# Patient Record
Sex: Female | Born: 1945 | Race: White | Hispanic: No | Marital: Married | Smoking: Former smoker
Health system: Southern US, Community
[De-identification: ages and names within clinical notes are randomized; demographics above are authoritative.]

## PROBLEM LIST (undated history)

## (undated) DIAGNOSIS — I10 Essential (primary) hypertension: Secondary | ICD-10-CM

## (undated) DIAGNOSIS — IMO0002 Reserved for concepts with insufficient information to code with codable children: Secondary | ICD-10-CM

## (undated) DIAGNOSIS — E079 Disorder of thyroid, unspecified: Secondary | ICD-10-CM

## (undated) DIAGNOSIS — L719 Rosacea, unspecified: Secondary | ICD-10-CM

## (undated) HISTORY — PX: TUBAL LIGATION: SHX77

## (undated) HISTORY — DX: Disorder of thyroid, unspecified: E07.9

## (undated) HISTORY — DX: Essential (primary) hypertension: I10

## (undated) HISTORY — DX: Reserved for concepts with insufficient information to code with codable children: IMO0002

## (undated) HISTORY — DX: Rosacea, unspecified: L71.9

## (undated) HISTORY — PX: BILATERAL TEMPOROMANDIBULAR JOINT ARTHROPLASTY: SUR77

---

## 1998-03-21 ENCOUNTER — Other Ambulatory Visit: Admission: RE | Admit: 1998-03-21 | Discharge: 1998-03-21 | Payer: Self-pay | Admitting: Obstetrics & Gynecology

## 1999-03-22 ENCOUNTER — Other Ambulatory Visit: Admission: RE | Admit: 1999-03-22 | Discharge: 1999-03-22 | Payer: Self-pay | Admitting: Obstetrics & Gynecology

## 1999-04-02 ENCOUNTER — Other Ambulatory Visit: Admission: RE | Admit: 1999-04-02 | Discharge: 1999-04-02 | Payer: Self-pay | Admitting: Obstetrics & Gynecology

## 2000-04-13 ENCOUNTER — Other Ambulatory Visit: Admission: RE | Admit: 2000-04-13 | Discharge: 2000-04-13 | Payer: Self-pay | Admitting: Obstetrics & Gynecology

## 2000-04-14 ENCOUNTER — Encounter (INDEPENDENT_AMBULATORY_CARE_PROVIDER_SITE_OTHER): Payer: Self-pay | Admitting: Specialist

## 2000-04-14 ENCOUNTER — Other Ambulatory Visit: Admission: RE | Admit: 2000-04-14 | Discharge: 2000-04-14 | Payer: Self-pay | Admitting: Obstetrics & Gynecology

## 2000-11-23 ENCOUNTER — Other Ambulatory Visit: Admission: RE | Admit: 2000-11-23 | Discharge: 2000-11-23 | Payer: Self-pay | Admitting: Obstetrics & Gynecology

## 2001-06-22 ENCOUNTER — Other Ambulatory Visit: Admission: RE | Admit: 2001-06-22 | Discharge: 2001-06-22 | Payer: Self-pay | Admitting: Obstetrics & Gynecology

## 2002-04-04 ENCOUNTER — Encounter: Payer: Self-pay | Admitting: Internal Medicine

## 2002-04-04 ENCOUNTER — Ambulatory Visit (HOSPITAL_COMMUNITY): Admission: RE | Admit: 2002-04-04 | Discharge: 2002-04-04 | Payer: Self-pay | Admitting: Internal Medicine

## 2002-04-08 ENCOUNTER — Encounter (INDEPENDENT_AMBULATORY_CARE_PROVIDER_SITE_OTHER): Payer: Self-pay | Admitting: *Deleted

## 2002-04-08 ENCOUNTER — Ambulatory Visit (HOSPITAL_COMMUNITY): Admission: RE | Admit: 2002-04-08 | Discharge: 2002-04-08 | Payer: Self-pay | Admitting: Internal Medicine

## 2002-04-08 ENCOUNTER — Encounter: Payer: Self-pay | Admitting: Internal Medicine

## 2002-08-17 ENCOUNTER — Other Ambulatory Visit: Admission: RE | Admit: 2002-08-17 | Discharge: 2002-08-17 | Payer: Self-pay | Admitting: Obstetrics & Gynecology

## 2003-05-12 ENCOUNTER — Ambulatory Visit (HOSPITAL_COMMUNITY): Admission: RE | Admit: 2003-05-12 | Discharge: 2003-05-12 | Payer: Self-pay | Admitting: Internal Medicine

## 2003-05-12 ENCOUNTER — Encounter: Payer: Self-pay | Admitting: Internal Medicine

## 2003-09-14 ENCOUNTER — Other Ambulatory Visit: Admission: RE | Admit: 2003-09-14 | Discharge: 2003-09-14 | Payer: Self-pay | Admitting: Obstetrics & Gynecology

## 2004-11-14 ENCOUNTER — Other Ambulatory Visit: Admission: RE | Admit: 2004-11-14 | Discharge: 2004-11-14 | Payer: Self-pay | Admitting: Obstetrics & Gynecology

## 2006-02-02 ENCOUNTER — Ambulatory Visit (HOSPITAL_COMMUNITY): Admission: RE | Admit: 2006-02-02 | Discharge: 2006-02-02 | Payer: Self-pay | Admitting: Internal Medicine

## 2006-03-17 ENCOUNTER — Encounter (INDEPENDENT_AMBULATORY_CARE_PROVIDER_SITE_OTHER): Payer: Self-pay | Admitting: *Deleted

## 2006-03-17 ENCOUNTER — Ambulatory Visit (HOSPITAL_COMMUNITY): Admission: RE | Admit: 2006-03-17 | Discharge: 2006-03-17 | Payer: Self-pay | Admitting: Internal Medicine

## 2006-03-20 ENCOUNTER — Other Ambulatory Visit: Admission: RE | Admit: 2006-03-20 | Discharge: 2006-03-20 | Payer: Self-pay | Admitting: Obstetrics & Gynecology

## 2007-04-12 ENCOUNTER — Ambulatory Visit (HOSPITAL_COMMUNITY): Admission: RE | Admit: 2007-04-12 | Discharge: 2007-04-12 | Payer: Self-pay | Admitting: Internal Medicine

## 2008-05-02 ENCOUNTER — Emergency Department (HOSPITAL_COMMUNITY): Admission: EM | Admit: 2008-05-02 | Discharge: 2008-05-02 | Payer: Self-pay | Admitting: Emergency Medicine

## 2011-01-30 ENCOUNTER — Other Ambulatory Visit: Payer: Self-pay | Admitting: Obstetrics & Gynecology

## 2011-01-30 DIAGNOSIS — R928 Other abnormal and inconclusive findings on diagnostic imaging of breast: Secondary | ICD-10-CM

## 2011-02-05 ENCOUNTER — Ambulatory Visit
Admission: RE | Admit: 2011-02-05 | Discharge: 2011-02-05 | Disposition: A | Discharge status: Home / Self Care | Payer: BC Managed Care – PPO | Source: Ambulatory Visit | Attending: Obstetrics & Gynecology | Admitting: Obstetrics & Gynecology

## 2011-02-05 DIAGNOSIS — R928 Other abnormal and inconclusive findings on diagnostic imaging of breast: Secondary | ICD-10-CM

## 2011-02-26 ENCOUNTER — Other Ambulatory Visit: Payer: Self-pay | Admitting: Internal Medicine

## 2011-02-26 DIAGNOSIS — E042 Nontoxic multinodular goiter: Secondary | ICD-10-CM

## 2011-03-04 ENCOUNTER — Ambulatory Visit (HOSPITAL_COMMUNITY)
Admission: RE | Admit: 2011-03-04 | Discharge: 2011-03-04 | Disposition: A | Discharge status: Home / Self Care | Payer: BC Managed Care – PPO | Source: Ambulatory Visit | Attending: Internal Medicine | Admitting: Internal Medicine

## 2011-03-04 DIAGNOSIS — E042 Nontoxic multinodular goiter: Secondary | ICD-10-CM

## 2012-02-11 ENCOUNTER — Encounter: Payer: Self-pay | Admitting: Internal Medicine

## 2012-02-16 ENCOUNTER — Encounter: Payer: Self-pay | Admitting: Internal Medicine

## 2012-02-24 ENCOUNTER — Ambulatory Visit (AMBULATORY_SURGERY_CENTER): Payer: BC Managed Care – PPO

## 2012-02-24 VITALS — Ht 67.0 in | Wt 166.4 lb

## 2012-02-24 DIAGNOSIS — Z1211 Encounter for screening for malignant neoplasm of colon: Secondary | ICD-10-CM

## 2012-02-24 MED ORDER — PEG-KCL-NACL-NASULF-NA ASC-C 100 G PO SOLR: 1.0000 | Freq: Once | ORAL | Status: AC

## 2012-02-25 ENCOUNTER — Encounter: Payer: Self-pay | Admitting: Internal Medicine

## 2012-03-09 ENCOUNTER — Encounter: Payer: BC Managed Care – PPO | Admitting: Internal Medicine

## 2012-03-09 ENCOUNTER — Ambulatory Visit (AMBULATORY_SURGERY_CENTER): Payer: BC Managed Care – PPO | Admitting: Internal Medicine

## 2012-03-09 ENCOUNTER — Encounter: Payer: Self-pay | Admitting: Internal Medicine

## 2012-03-09 VITALS — BP 160/74 | HR 76 | Temp 97.0°F | Resp 18 | Ht 67.0 in | Wt 166.0 lb

## 2012-03-09 DIAGNOSIS — Z1211 Encounter for screening for malignant neoplasm of colon: Secondary | ICD-10-CM

## 2012-03-09 MED ORDER — SODIUM CHLORIDE 0.9 % IV SOLN: 500.0000 mL | INTRAVENOUS | Status: DC

## 2012-03-09 NOTE — Progress Notes (Signed)
Patient did not experience any of the following events: a burn prior to discharge; a fall within the facility; wrong site/side/patient/procedure/implant event; or a hospital transfer or hospital admission upon discharge from the facility. (G8907) Patient did not have preoperative order for IV antibiotic SSI prophylaxis. (G8918)  

## 2012-03-09 NOTE — Op Note (Signed)
[facility] [address] [place], [place]  [address]  COLONOSCOPY PROCEDURE REPORT  PATIENT:  Marilyn [name], Marilyn [name]  MR#:  [id number] BIRTHDATE:  01/31/1946, 65 yrs. old  GENDER:  female ENDOSCOPIST:  Hedwig Morton. Juanda Chance, MD REF. BY:  Lynnea Ferrier, M.D. PROCEDURE DATE:  03/09/2012 PROCEDURE:  Colonoscopy 29528 ASA CLASS:  Class II INDICATIONS:  colorectal cancer screening, average risk last colon 2003 MEDICATIONS:   MAC sedation, administered by CRNA, propofol (Diprivan) 200 mg  DESCRIPTION OF PROCEDURE:   After the risks and benefits and of the procedure were explained, informed consent was obtained. Digital rectal exam was performed and revealed no rectal masses. The LB 180AL [id number] endoscope was introduced through the anus and advanced to the cecum, which was identified by both the appendix and ileocecal valve.  The quality of the prep was good, using MoviPrep.  The instrument was then slowly withdrawn as the colon was fully examined. <<PROCEDUREIMAGES>>  FINDINGS:  No polyps or cancers were seen (see image1, image2, image3, image4, and image5).   Retroflexed views in the rectum revealed no abnormalities.    The scope was then withdrawn from the patient and the procedure completed.  COMPLICATIONS:  None ENDOSCOPIC IMPRESSION: 1) No polyps or cancers 2) Normal colonoscopy RECOMMENDATIONS: 1) High fiber diet.  REPEAT EXAM:  In 10 year(s) for.  ______________________________ Hedwig Morton. Juanda Chance, MD  CC:  n. eSIGNED:   Hedwig Morton. Jaquala Fuller at 03/09/2012 05:46 PM  [place], [phone]

## 2012-03-09 NOTE — Patient Instructions (Signed)

## 2012-03-10 ENCOUNTER — Telehealth: Payer: Self-pay | Admitting: *Deleted

## 2012-03-10 NOTE — Telephone Encounter (Signed)
  Follow up Call-  Call back number 03/09/2012  Post procedure Call Back phone  # [phone]  Permission to leave phone message Yes     Patient questions:  Do you have a fever, pain , or abdominal swelling? no Pain Score  0 *  Have you tolerated food without any problems? yes  Have you been able to return to your normal activities? yes  Do you have any questions about your discharge instructions: Diet   no Medications  no Follow up visit  no  Do you have questions or concerns about your Care? no  Actions: * If pain score is 4 or above: No action needed, pain <4.

## 2013-04-05 ENCOUNTER — Other Ambulatory Visit: Payer: Self-pay | Admitting: Obstetrics & Gynecology

## 2013-04-18 ENCOUNTER — Other Ambulatory Visit: Payer: Self-pay | Admitting: Obstetrics & Gynecology

## 2013-04-18 DIAGNOSIS — R928 Other abnormal and inconclusive findings on diagnostic imaging of breast: Secondary | ICD-10-CM

## 2013-04-20 ENCOUNTER — Ambulatory Visit (INDEPENDENT_AMBULATORY_CARE_PROVIDER_SITE_OTHER): Payer: Medicare Other | Admitting: Physician Assistant

## 2013-04-20 ENCOUNTER — Encounter: Payer: Self-pay | Admitting: Physician Assistant

## 2013-04-20 VITALS — BP 144/88 | HR 72 | Temp 98.2°F | Resp 18 | Ht 65.5 in | Wt 163.0 lb

## 2013-04-20 DIAGNOSIS — I10 Essential (primary) hypertension: Secondary | ICD-10-CM

## 2013-04-20 MED ORDER — BENAZEPRIL HCL 10 MG PO TABS: 10.0000 mg | ORAL_TABLET | Freq: Every day | ORAL | Status: DC

## 2013-04-20 NOTE — Progress Notes (Signed)
   Patient ID: Marilyn [name] MRN: [id number], DOB: 1946-12-07, 67 y.o. Date of Encounter: 04/20/2013, 5:06 PM    Chief Complaint:  Chief Complaint  Patient presents with  . BP high at gyn exam last week  174/93     HPI: 66 y.o. year old female here to evaluate high blood pressure. Has no prior h/o high BP. Nurse for [facility] checked it 3 weeks ago-got 150/80. Pt went to Gyn last week-BP was 174/93. Pt has checked it some since and has gotten 140s/90.  She is taking no new medications. No decongestants. No caffeine, energy drinks etc. Has had no angina symptoms, stroke symptoms.     Home Meds: Current Outpatient Prescriptions on File Prior to Visit  Medication Sig Dispense Refill  . aspirin 81 MG tablet Take 81 mg by mouth daily.      . B Complex-Biotin-FA (B-COMPLEX PO) Take by mouth daily.      . calcium carbonate (OS-CAL) 600 MG TABS Take 600 mg by mouth daily.      Marland Kitchen estradiol (ESTRACE) 2 MG tablet Take 2 mg by mouth daily.      . Fish Oil-Cholecalciferol (FISH OIL + D3 PO) Take by mouth. Fish Oil 1200 mg/ D3 2000 IU  Take one daily      . Glucosamine-Chondroitin (COSAMIN DS PO) Take by mouth daily. Take 1 pill on odd days and 2 pills on even days      . levothyroxine (SYNTHROID, LEVOTHROID) 75 MCG tablet Take 75 mcg by mouth daily.      . vitamin E (VITAMIN E) 1000 UNIT capsule Take 1,000 Units by mouth daily.       No current facility-administered medications on file prior to visit.    Allergies: No Known Allergies    Review of Systems: See HPI. Other ros negative.   Physical Exam: Blood pressure 144/88, pulse 72, temperature 98.2 F (36.8 C), temperature source Oral, resp. rate 18, height 5' 5.5" (1.664 m), weight 163 lb (73.936 kg)., Body mass index is 26.7 kg/(m^2). General: Well developed, well nourished,WF in no acute distress. Neck: Supple. No thyromegaly. Full ROM. No lymphadenopathy.No carotid bruits. Lungs: Clear bilaterally to auscultation without wheezes, rales,  or rhonchi. Breathing is unlabored. Heart: Regular rhythm. No murmurs, rubs, or gallops. Abdomen: Soft, non-tender, non-distended with normoactive bowel sounds. No hepatomegaly. No rebound/guarding. No obvious abdominal masses. Msk:  Strength and tone normal for age. Extremities/Skin: Warm and dry. No clubbing or cyanosis. No edema. No rashes or suspicious lesions. Neuro: Alert and oriented X 3. Moves all extremities spontaneously. Gait is normal. CNII-XII grossly in tact. Psych:  Responds to questions appropriately with a normal affect.     ASSESSMENT AND PLAN:  67 y.o. year old female with  1. Essential hypertension, benign Discussed low sodium foods. Avoid canned foods, frozen dinners, other high sodium foods. Continue cardiovascular exercise. (Has Family H/O HTN). - benazepril (LOTENSIN) 10 MG tablet; Take 1 tablet (10 mg total) by mouth daily.  She is going out of town May 3rd and will be gone for 4 weeks.  She is to [facility] in one week (prior to trip) to recheck BP and BMET on medication.    [address] [place], [place], [facility] 04/20/2013 5:06 PM

## 2013-04-29 ENCOUNTER — Encounter: Payer: Self-pay | Admitting: Physician Assistant

## 2013-04-29 ENCOUNTER — Ambulatory Visit
Admission: RE | Admit: 2013-04-29 | Discharge: 2013-04-29 | Disposition: A | Discharge status: Home / Self Care | Payer: Medicare Other | Source: Ambulatory Visit | Attending: Obstetrics & Gynecology | Admitting: Obstetrics & Gynecology

## 2013-04-29 ENCOUNTER — Ambulatory Visit (INDEPENDENT_AMBULATORY_CARE_PROVIDER_SITE_OTHER): Payer: Medicare Other | Admitting: Physician Assistant

## 2013-04-29 VITALS — BP 116/78 | HR 64 | Resp 18 | Ht 66.5 in | Wt 162.0 lb

## 2013-04-29 DIAGNOSIS — R928 Other abnormal and inconclusive findings on diagnostic imaging of breast: Secondary | ICD-10-CM

## 2013-04-29 DIAGNOSIS — I10 Essential (primary) hypertension: Secondary | ICD-10-CM

## 2013-04-29 NOTE — Progress Notes (Signed)
.     Patient ID: Marilyn [name] MRN: [id number], DOB: 01/25/1946, 67 y.o. Date of Encounter: 04/29/2013, 2:21 PM    Chief Complaint:  Chief Complaint  Patient presents with  . BP follow up     HPI: 67 y.o. year old female here to f/u HTN. Had OV here 1-2 weeks ago b/c BP had been elevated at Gyn appt and when [facility] nurse checked it. Started Lotensin 10 mg QD at that OV. Pt taking as directed. No adverse effect. Has checked BP since-getting mostly 120s systolic.      Home Meds: Current Outpatient Prescriptions on File Prior to Visit  Medication Sig Dispense Refill  . aspirin 81 MG tablet Take 81 mg by mouth daily.      . B Complex-Biotin-FA (B-COMPLEX PO) Take by mouth daily.      . benazepril (LOTENSIN) 10 MG tablet Take 1 tablet (10 mg total) by mouth daily.  90 tablet  3  . BIOTIN PO Take 1 tablet by mouth daily.      . calcium carbonate (OS-CAL) 600 MG TABS Take 600 mg by mouth daily.      Marland Kitchen estradiol (ESTRACE) 2 MG tablet Take 2 mg by mouth daily.      . Fish Oil-Cholecalciferol (FISH OIL + D3 PO) Take by mouth. Fish Oil 1200 mg/ D3 2000 IU  Take one daily      . Glucosamine-Chondroitin (COSAMIN DS PO) Take by mouth daily. Take 1 pill on odd days and 2 pills on even days      . levothyroxine (SYNTHROID, LEVOTHROID) 75 MCG tablet Take 75 mcg by mouth daily.      . progesterone (PROMETRIUM) 100 MG capsule Take 100 mg by mouth. Takes 12 days in a row every three months      . vitamin E (VITAMIN E) 1000 UNIT capsule Take 1,000 Units by mouth daily.       No current facility-administered medications on file prior to visit.    Allergies: No Known Allergies    Review of Systems: negative.    Physical Exam: Blood pressure 116/78, pulse 64, resp. rate 18, height 5' 6.5" (1.689 m), weight 162 lb (73.483 kg)., Body mass index is 25.76 kg/(m^2). General: Well developed, well nourished,WF. appears in no acute distress. Neck: Supple. No thyromegaly. Full ROM. No lymphadenopathy. Lungs:  Clear bilaterally to auscultation without wheezes, rales, or rhonchi. Breathing is unlabored. Heart: Regular rhythm. No murmurs, rubs, or gallops. Msk:  Strength and tone normal for age. Extremities/Skin: Warm and dry. No clubbing or cyanosis. No edema. No rashes or suspicious lesions. Neuro: Alert and oriented X 3. Moves all extremities spontaneously. Gait is normal. CNII-XII grossly in tact. Psych:  Responds to questions appropriately with a normal affect.     ASSESSMENT AND PLAN:  67 y.o. year old female with  1. Hypertension At goal. Cont current med. Check Lab. - BASIC METABOLIC PANEL WITH GFR  ROV 6 months, sooner if needed  Signed, [address] [place], [place], [facility] 04/29/2013 2:21 PM

## 2013-04-30 LAB — BASIC METABOLIC PANEL WITH GFR
BUN: 18 mg/dL (ref 6–23)
CO2: 27 mEq/L (ref 19–32)
Chloride: 101 mEq/L (ref 96–112)
Glucose, Bld: 117 mg/dL — ABNORMAL HIGH (ref 70–99)
Potassium: 4.9 mEq/L (ref 3.5–5.3)

## 2014-01-04 ENCOUNTER — Ambulatory Visit (INDEPENDENT_AMBULATORY_CARE_PROVIDER_SITE_OTHER): Payer: Medicare HMO | Admitting: Family Medicine

## 2014-01-04 DIAGNOSIS — Z23 Encounter for immunization: Secondary | ICD-10-CM

## 2014-01-31 ENCOUNTER — Encounter: Payer: Self-pay | Admitting: Family Medicine

## 2014-01-31 ENCOUNTER — Ambulatory Visit (INDEPENDENT_AMBULATORY_CARE_PROVIDER_SITE_OTHER): Payer: Medicare HMO | Admitting: Family Medicine

## 2014-01-31 VITALS — BP 144/94 | HR 60 | Temp 98.0°F | Resp 18 | Ht 67.25 in | Wt 162.0 lb

## 2014-01-31 DIAGNOSIS — R42 Dizziness and giddiness: Secondary | ICD-10-CM

## 2014-01-31 DIAGNOSIS — I10 Essential (primary) hypertension: Secondary | ICD-10-CM

## 2014-01-31 NOTE — Progress Notes (Signed)
Patient ID: Kathi Ludwig, female   DOB: 11-24-1946, 68 y.o.   MRN: [id number]   Subjective:    Patient ID: Kathi Ludwig, female    DOB: [birth year], 68 y.o.   MRN: [id number]  Patient presents for c/o being dizzy, BP up  patient with history of hypertension she's currently on benazepril 10 mg her blood pressure has been fairly well maintained on this dose. Over the past couple of days she's had a dizzy feeling as well as mild headache and a hot flash yesterday. She was working at [employer] when she began to feel more dizzy she did check her blood pressure afterwards and was initially 135/60 she repeated it at night and noticed that her blood pressure was increasing to 527-782 systolic this morning and on her home cuff her blood pressure systolic was 423N. Her other comorbidities include hypothyroidism which is been well-controlled by endocrinology on replacement. She denies any recent illness in any over-the-counter medications. She denies any syncopal event or chest pain.    Review Of Systems:  GEN- denies fatigue, fever, weight loss,weakness, recent illness HEENT- denies eye drainage, change in vision, nasal discharge, CVS- denies chest pain, palpitations RESP- denies SOB, cough, wheeze Neuro- + headache,+ dizziness, syncope, seizure activity       Objective:    BP 144/94  Pulse 60  Temp(Src) 98 F (36.7 C) (Oral)  Resp 18  Ht 5' 7.25" (1.708 m)  Wt 162 lb (73.483 kg)  BMI 25.19 kg/m2 GEN- NAD, alert and oriented x3 HEENT- PERRL, EOMI, non injected sclera, pink conjunctiva, MMM, oropharynx clear, fundus benign Neck- Supple,  CVS- RRR, no murmur RESP-CTAB EXT- No edema Pulses- Radial, DP- 2+ Neuro- CNII-XII in tact no deficits   Orthostatics- Lying 150/78 , sitting 160/82, standing 162/78       Assessment & Plan:      Problem List Items Addressed This Visit   Hypertension - Primary   Relevant Medications      benazepril (LOTENSIN) 10 MG tablet   Other Relevant  Orders      Basic metabolic panel      CBC with Differential      Note: This dictation was prepared with Dragon dictation along with smaller phrase technology. Any transcriptional errors that result from this process are unintentional.

## 2014-01-31 NOTE — Assessment & Plan Note (Signed)
Per above, also possible that she may be developing an upper respiratory illness with the head stuffiness

## 2014-01-31 NOTE — Assessment & Plan Note (Signed)
Her symptoms are likely related to her blood pressure however she could have the beginnings of a viral illness as well contributing to the head stuffiness. With her increase in blood pressure will increase her benazepril to 20 mg she will take 2 of her 10 mg tablets and check her blood pressure at home she will followup in one week. Metabolic panel and CBC were done

## 2014-01-31 NOTE — Patient Instructions (Signed)
Increase benazepril to 20mg  once a day  We will call with lab results  F/U 1 week for blood pressure bring monitor

## 2014-02-01 LAB — BASIC METABOLIC PANEL
BUN: 12 mg/dL (ref 6–23)
CALCIUM: 9.3 mg/dL (ref 8.4–10.5)
CO2: 28 mEq/L (ref 19–32)
Chloride: 102 mEq/L (ref 96–112)
Creat: 0.8 mg/dL (ref 0.50–1.10)
Glucose, Bld: 89 mg/dL (ref 70–99)
Potassium: 4.4 mEq/L (ref 3.5–5.3)
SODIUM: 136 meq/L (ref 135–145)

## 2014-02-01 LAB — CBC WITH DIFFERENTIAL/PLATELET
BASOS ABS: 0 10*3/uL (ref 0.0–0.1)
Basophils Relative: 0 % (ref 0–1)
EOS PCT: 2 % (ref 0–5)
Eosinophils Absolute: 0.1 10*3/uL (ref 0.0–0.7)
HCT: 38 % (ref 36.0–46.0)
Hemoglobin: 12.3 g/dL (ref 12.0–15.0)
LYMPHS ABS: 2.2 10*3/uL (ref 0.7–4.0)
LYMPHS PCT: 29 % (ref 12–46)
MCH: 30 pg (ref 26.0–34.0)
MCHC: 32.4 g/dL (ref 30.0–36.0)
MCV: 92.7 fL (ref 78.0–100.0)
Monocytes Absolute: 0.7 10*3/uL (ref 0.1–1.0)
Monocytes Relative: 10 % (ref 3–12)
NEUTROS ABS: 4.4 10*3/uL (ref 1.7–7.7)
Neutrophils Relative %: 59 % (ref 43–77)
PLATELETS: 329 10*3/uL (ref 150–400)
RBC: 4.1 MIL/uL (ref 3.87–5.11)
RDW: 13.9 % (ref 11.5–15.5)
WBC: 7.4 10*3/uL (ref 4.0–10.5)

## 2014-02-10 ENCOUNTER — Ambulatory Visit (INDEPENDENT_AMBULATORY_CARE_PROVIDER_SITE_OTHER): Payer: Medicare HMO | Admitting: Family Medicine

## 2014-02-10 ENCOUNTER — Encounter: Payer: Self-pay | Admitting: Family Medicine

## 2014-02-10 VITALS — BP 130/70 | HR 64 | Temp 97.7°F | Resp 18 | Ht 67.25 in | Wt 161.0 lb

## 2014-02-10 DIAGNOSIS — R42 Dizziness and giddiness: Secondary | ICD-10-CM

## 2014-02-10 DIAGNOSIS — I10 Essential (primary) hypertension: Secondary | ICD-10-CM

## 2014-02-10 NOTE — Patient Instructions (Signed)
Continue the benazepril at 20mg  F/U 3 month for blood pressure

## 2014-02-12 ENCOUNTER — Encounter: Payer: Self-pay | Admitting: Family Medicine

## 2014-02-12 NOTE — Progress Notes (Signed)
Patient ID: Marilyn [name], female   DOB: 07-16-1946, 68 y.o.   MRN: [id number]   Subjective:    Patient ID: Marilyn [name], female    DOB: [birth year], 68 y.o.   MRN: [id number]  Patient presents for 1 week follow up   patient here to followup hypertension. She didn't bring her home meter and it is quite close to our blood pressure cuff here in the office. She increased her Benicar 20 mg and her dizzy spells have resolved. She's been back to exercise as well as in the hot sun without any symptoms. Her blood pressure at home has been 120s to 130s over 50-70   Review Of Systems:  GEN- denies fatigue, fever, weight loss,weakness, recent illness HEENT- denies eye drainage, change in vision, nasal discharge, CVS- denies chest pain, palpitations RESP- denies SOB, cough, wheeze Neuro- denies headache, dizziness, syncope, seizure activity       Objective:    BP 130/70  Pulse 64  Temp(Src) 97.7 F (36.5 C) (Oral)  Resp 18  Ht 5' 7.25" (1.708 m)  Wt 161 lb (73.029 kg)  BMI 25.03 kg/m2 GEN- NAD, alert and oriented x3 CVS- RRR, no murmur RESP-CTAB EXT- No edema Pulses- radial 2+        Assessment & Plan:      Problem List Items Addressed This Visit   Hypertension - Primary     Blood pressure is much improved we'll continue Lotensin 20 mg    Relevant Medications      benazepril (LOTENSIN) 20 MG tablet   Dizziness and giddiness     Her symptoms have now resolved that her blood pressure is under better control.       Note: This dictation was prepared with Dragon dictation along with smaller phrase technology. Any transcriptional errors that result from this process are unintentional.

## 2014-02-12 NOTE — Assessment & Plan Note (Signed)
Blood pressure is much improved we'll continue Lotensin 20 mg

## 2014-02-12 NOTE — Assessment & Plan Note (Signed)
Her symptoms have now resolved that her blood pressure is under better control.

## 2014-04-26 ENCOUNTER — Other Ambulatory Visit: Payer: Self-pay | Admitting: Obstetrics & Gynecology

## 2014-06-02 ENCOUNTER — Encounter: Payer: Self-pay | Admitting: Family Medicine

## 2014-06-02 ENCOUNTER — Ambulatory Visit (INDEPENDENT_AMBULATORY_CARE_PROVIDER_SITE_OTHER): Payer: Medicare HMO | Admitting: Family Medicine

## 2014-06-02 VITALS — BP 148/80 | HR 64 | Temp 97.9°F | Resp 16 | Ht 68.0 in | Wt 156.0 lb

## 2014-06-02 DIAGNOSIS — E039 Hypothyroidism, unspecified: Secondary | ICD-10-CM | POA: Insufficient documentation

## 2014-06-02 DIAGNOSIS — I10 Essential (primary) hypertension: Secondary | ICD-10-CM

## 2014-06-02 LAB — COMPREHENSIVE METABOLIC PANEL
ALBUMIN: 3.9 g/dL (ref 3.5–5.2)
ALK PHOS: 48 U/L (ref 39–117)
ALT: 12 U/L (ref 0–35)
AST: 20 U/L (ref 0–37)
BUN: 14 mg/dL (ref 6–23)
CALCIUM: 9.2 mg/dL (ref 8.4–10.5)
CO2: 24 mEq/L (ref 19–32)
Chloride: 104 mEq/L (ref 96–112)
Creat: 0.84 mg/dL (ref 0.50–1.10)
GLUCOSE: 90 mg/dL (ref 70–99)
POTASSIUM: 4.8 meq/L (ref 3.5–5.3)
Sodium: 138 mEq/L (ref 135–145)
Total Bilirubin: 0.4 mg/dL (ref 0.2–1.2)
Total Protein: 6.6 g/dL (ref 6.0–8.3)

## 2014-06-02 LAB — CBC WITH DIFFERENTIAL/PLATELET
BASOS PCT: 0 % (ref 0–1)
Basophils Absolute: 0 10*3/uL (ref 0.0–0.1)
Eosinophils Absolute: 0.2 10*3/uL (ref 0.0–0.7)
Eosinophils Relative: 3 % (ref 0–5)
HEMATOCRIT: 38.7 % (ref 36.0–46.0)
HEMOGLOBIN: 12.9 g/dL (ref 12.0–15.0)
LYMPHS ABS: 1.7 10*3/uL (ref 0.7–4.0)
Lymphocytes Relative: 34 % (ref 12–46)
MCH: 30.7 pg (ref 26.0–34.0)
MCHC: 33.3 g/dL (ref 30.0–36.0)
MCV: 92.1 fL (ref 78.0–100.0)
MONO ABS: 0.5 10*3/uL (ref 0.1–1.0)
MONOS PCT: 10 % (ref 3–12)
NEUTROS ABS: 2.7 10*3/uL (ref 1.7–7.7)
NEUTROS PCT: 53 % (ref 43–77)
Platelets: 320 10*3/uL (ref 150–400)
RBC: 4.2 MIL/uL (ref 3.87–5.11)
RDW: 13.8 % (ref 11.5–15.5)
WBC: 5.1 10*3/uL (ref 4.0–10.5)

## 2014-06-02 LAB — LIPID PANEL
Cholesterol: 207 mg/dL — ABNORMAL HIGH (ref 0–200)
HDL: 56 mg/dL (ref >39)
LDL CALC: 139 mg/dL — AB (ref 0–99)
TRIGLYCERIDES: 60 mg/dL (ref <150)
Total CHOL/HDL Ratio: 3.7 Ratio
VLDL: 12 mg/dL (ref 0–40)

## 2014-06-02 MED ORDER — BENAZEPRIL HCL 20 MG PO TABS: 20.0000 mg | ORAL_TABLET | Freq: Every day | ORAL | Status: DC

## 2014-06-02 NOTE — Patient Instructions (Signed)
Continue current medications  We will send letter with normal labs  Keep and eye blood pressure  F/U 6 months

## 2014-06-02 NOTE — Progress Notes (Signed)
Patient ID: Marilyn [name], female   DOB: 05-13-1946, 68 y.o.   MRN: [id number]   Subjective:    Patient ID: Marilyn [name], female    DOB: 12/29/1946, 68 y.o.   MRN: [id number]  Patient presents for 3 month F/U  Patient here to followup hypertension. She's no specific concerns today. States that she was at the beach last month in a few occasions her blood pressure was actually running on the lower side less than 120 she not have any dizziness headache or fatigue associated. Said she's been back home her blood pressure is been from 130s to 140s. He was elevated at her GYN in the 150s. She's not missing doses of her medications. She is due for fasting labs. Of note she is followed by endocrinologist Dr. Jeanann Lewandowsky for her hypothyroidism. Also she was seen by her GYN and they're starting to taper her off of her hormone therapy which she's been on for many years.   Review Of Systems:  GEN- denies fatigue, fever, weight loss,weakness, recent illness HEENT- denies eye drainage, change in vision, nasal discharge, CVS- denies chest pain, palpitations RESP- denies SOB, cough, wheeze ABD- denies N/V, change in stools, abd pain GU- denies dysuria, hematuria, dribbling, incontinence MSK- denies joint pain, muscle aches, injury Neuro- denies headache, dizziness, syncope, seizure activity       Objective:    BP 132/80  Pulse 64  Temp(Src) 97.9 F (36.6 C) (Oral)  Resp 16  Ht 5\' 8"  (1.727 m)  Wt 156 lb (70.761 kg)  BMI 23.73 kg/m2 GEN- NAD, alert and oriented x3 HEENT- PERRL, EOMI, non injected sclera, pink conjunctiva, MMM, oropharynx clear Neck- Supple CVS- RRR, no murmur RESP-CTAB EXT- No edema Pulses- Radial, DP- 2+        Assessment & Plan:      Problem List Items Addressed This Visit   None      Note: This dictation was prepared with Dragon dictation along with smaller phrase technology. Any transcriptional errors that result from this process are unintentional.

## 2014-06-02 NOTE — Assessment & Plan Note (Signed)
Based on her age and lack of coronary artery disease diabetes mellitus her blood pressure looks okay today. Will continue current dose she will continue monitoring at home. Her fasting labs were drawn today she's not had a lipid panel greater than one year. Renal function will be checked

## 2014-07-17 ENCOUNTER — Encounter: Payer: Self-pay | Admitting: Family Medicine

## 2014-07-17 ENCOUNTER — Ambulatory Visit (INDEPENDENT_AMBULATORY_CARE_PROVIDER_SITE_OTHER): Payer: Medicare HMO | Admitting: Family Medicine

## 2014-07-17 VITALS — BP 126/58 | HR 78 | Temp 97.3°F | Resp 14 | Ht 67.5 in | Wt 156.0 lb

## 2014-07-17 DIAGNOSIS — B029 Zoster without complications: Secondary | ICD-10-CM

## 2014-07-17 MED ORDER — OXYCODONE-ACETAMINOPHEN 5-325 MG PO TABS: 1.0000 | ORAL_TABLET | Freq: Four times a day (QID) | ORAL | Status: DC | PRN

## 2014-07-17 MED ORDER — VALACYCLOVIR HCL 1 G PO TABS: 1000.0000 mg | ORAL_TABLET | Freq: Three times a day (TID) | ORAL | Status: DC

## 2014-07-17 NOTE — Progress Notes (Signed)
Subjective:    Patient ID: Marilyn [name], female    DOB: [birth year], 68 y.o.   MRN: [id number]  HPI  Patient has a three-day history of painful, blistering rash on her left flank radiating around her rib cage directly below her left breast all the way to her sternum. The pain is intense and burning in nature.  She believes she may have shingles. The rash is consistent of groups of yellow vesicles on an erythematous base. There are several clusters in a linear pattern. Past Medical History  Diagnosis Date  . Thyroid disease   . Osteoporosis   . Bulging disc   . Hypertension   . Rosacea    Current Outpatient Prescriptions on File Prior to Visit  Medication Sig Dispense Refill  . aspirin 81 MG tablet Take 81 mg by mouth daily.      . B Complex-Biotin-FA (B-COMPLEX PO) Take by mouth daily.      . benazepril (LOTENSIN) 20 MG tablet Take 1 tablet (20 mg total) by mouth daily.  90 tablet  1  . BIOTIN PO Take 1 tablet by mouth daily.      . calcium carbonate (OS-CAL) 600 MG TABS Take 600 mg by mouth daily.      . Cholecalciferol (VITAMIN D) 2000 UNITS tablet Take 2,000 Units by mouth daily.      Marland Kitchen estradiol (ESTRACE) 2 MG tablet Take 2 mg by mouth every other day.       . Fish Oil-Cholecalciferol (FISH OIL + D3 PO) Take by mouth. Fish Oil 1200 mg/ D3 2000 IU  Take one daily      . Glucosamine-Chondroitin (COSAMIN DS PO) Take by mouth daily. Take 1 pill on odd days and 2 pills on even days      . levothyroxine (SYNTHROID, LEVOTHROID) 75 MCG tablet Take 75 mcg by mouth daily.      . progesterone (PROMETRIUM) 100 MG capsule Take 100 mg by mouth every other day.       . vitamin E (VITAMIN E) 1000 UNIT capsule Take 1,000 Units by mouth daily.       No current facility-administered medications on file prior to visit.   No Known Allergies History   Social History  . Marital Status: Married    Spouse Name: N/A    Number of Children: N/A  . Years of Education: N/A   Occupational History    . Not on file.   Social History Main Topics  . Smoking status: Former Smoker    Types: Cigarettes    Quit date: 02/24/1992  . Smokeless tobacco: Never Used  . Alcohol Use: No  . Drug Use: No  . Sexual Activity: Not on file   Other Topics Concern  . Not on file   Social History Narrative  . No narrative on file     Review of Systems  All other systems reviewed and are negative.      Objective:   Physical Exam  Vitals reviewed. Cardiovascular: Normal rate, regular rhythm and normal heart sounds.   Pulmonary/Chest: Effort normal and breath sounds normal.  Skin: Rash noted. There is erythema.   rash as described in the history of present illness.        Assessment & Plan:  1. Shingles I believe the patient does have shingles. Begin Valtrex 1 g by mouth 3 times a day for 7 days. Percocet 5/325 one to 2 tablets every 6 hours as needed for pain. Recheck in 1  week if no better or sooner if worse. - valACYclovir (VALTREX) 1000 MG tablet; Take 1 tablet (1,000 mg total) by mouth 3 (three) times daily.  Dispense: 21 tablet; Refill: 0 - oxyCODONE-acetaminophen (ROXICET) 5-325 MG per tablet; Take 1-2 tablets by mouth every 6 (six) hours as needed for severe pain.  Dispense: 30 tablet; Refill: 0

## 2014-09-06 ENCOUNTER — Telehealth: Payer: Self-pay | Admitting: *Deleted

## 2014-09-06 NOTE — Telephone Encounter (Signed)
Submitted [employer] referral thru [employer] for authorization for Dr. Jeanann Lewandowsky, MD endocrinologist with authorization number [id number], will fax to [phone] once receive silveback form

## 2014-10-13 ENCOUNTER — Other Ambulatory Visit: Payer: Self-pay | Admitting: Physician Assistant

## 2014-10-13 NOTE — Telephone Encounter (Signed)
Medication refilled per protocol. 

## 2014-11-02 ENCOUNTER — Ambulatory Visit (INDEPENDENT_AMBULATORY_CARE_PROVIDER_SITE_OTHER): Payer: Medicare HMO | Admitting: Family Medicine

## 2014-11-02 ENCOUNTER — Encounter: Payer: Self-pay | Admitting: Family Medicine

## 2014-11-02 VITALS — BP 132/80 | HR 72 | Temp 98.1°F | Wt 164.0 lb

## 2014-11-02 DIAGNOSIS — M722 Plantar fascial fibromatosis: Secondary | ICD-10-CM

## 2014-11-02 MED ORDER — DICLOFENAC SODIUM 75 MG PO TBEC: 75.0000 mg | DELAYED_RELEASE_TABLET | Freq: Two times a day (BID) | ORAL | Status: DC

## 2014-11-02 NOTE — Progress Notes (Signed)
   Subjective:    Patient ID: Marilyn [name], female    DOB: October 09, 1946, 68 y.o.   MRN: [id number]  HPI  Patient has had 3-4 weeks of pain in both heels. The pain is located on the plantar aspect of the calcaneus near the insertion of the plantar fascia. Pain is worse first thing in the morning. It improves after a few steps and then returns later during the day. She has tried Advil with minimal relief. Past Medical History  Diagnosis Date  . Thyroid disease   . Osteoporosis   . Bulging disc   . Hypertension   . Rosacea    Past Surgical History  Procedure Laterality Date  . Tubal ligation    . Bilateral temporomandibular joint arthroplasty     Current Outpatient Prescriptions on File Prior to Visit  Medication Sig Dispense Refill  . aspirin 81 MG tablet Take 81 mg by mouth daily.    . B Complex-Biotin-FA (B-COMPLEX PO) Take by mouth daily.    . benazepril (LOTENSIN) 20 MG tablet TAKE 1 TABLET EVERY DAY 90 tablet 0  . BIOTIN PO Take 1 tablet by mouth daily.    . calcium carbonate (OS-CAL) 600 MG TABS Take 600 mg by mouth daily.    . Cholecalciferol (VITAMIN D) 2000 UNITS tablet Take 2,000 Units by mouth daily.    Marland Kitchen estradiol (ESTRACE) 2 MG tablet Take 2 mg by mouth every other day.     . Fish Oil-Cholecalciferol (FISH OIL + D3 PO) Take by mouth. Fish Oil 1200 mg/ D3 2000 IU  Take one daily    . Glucosamine-Chondroitin (COSAMIN DS PO) Take by mouth daily. Take 1 pill on odd days and 2 pills on even days    . levothyroxine (SYNTHROID, LEVOTHROID) 75 MCG tablet Take 75 mcg by mouth daily.    . vitamin E (VITAMIN E) 1000 UNIT capsule Take 1,000 Units by mouth daily.     No current facility-administered medications on file prior to visit.   No Known Allergies History   Social History  . Marital Status: Married    Spouse Name: N/A    Number of Children: N/A  . Years of Education: N/A   Occupational History  . Not on file.   Social History Main Topics  . Smoking status: Former  Smoker    Types: Cigarettes    Quit date: 02/24/1992  . Smokeless tobacco: Never Used  . Alcohol Use: No  . Drug Use: No  . Sexual Activity: Not on file   Other Topics Concern  . Not on file   Social History Narrative       Review of Systems  All other systems reviewed and are negative.      Objective:   Physical Exam  Cardiovascular: Normal rate, regular rhythm and normal heart sounds.   Pulmonary/Chest: Effort normal and breath sounds normal.  Vitals reviewed. Patient is tender to palpation near the insertion of the plantar fascia on the plantar surface of the calcaneus in both feet. The left heel is worse than the right heel.        Assessment & Plan:  Plantar fasciitis, bilateral - Plan: diclofenac (VOLTAREN) 75 MG EC tablet  I recommended home physical therapy stretching exercises. I recommended diclofenac 75 mg by mouth twice a day. I recommended orthotics for her shoes. If patient's symptoms are no better in 2-3 weeks I would return for a cortisone injection

## 2014-11-13 ENCOUNTER — Encounter: Payer: Self-pay | Admitting: Family Medicine

## 2014-11-13 ENCOUNTER — Ambulatory Visit (INDEPENDENT_AMBULATORY_CARE_PROVIDER_SITE_OTHER): Payer: Medicare HMO | Admitting: Family Medicine

## 2014-11-13 VITALS — BP 140/82 | HR 84 | Temp 98.5°F | Resp 14 | Ht 67.5 in | Wt 160.0 lb

## 2014-11-13 DIAGNOSIS — J069 Acute upper respiratory infection, unspecified: Secondary | ICD-10-CM

## 2014-11-13 MED ORDER — AZITHROMYCIN 250 MG PO TABS: ORAL_TABLET | ORAL | Status: DC

## 2014-11-13 MED ORDER — GUAIFENESIN-CODEINE 100-10 MG/5ML PO SOLN: ORAL | Status: DC

## 2014-11-13 NOTE — Progress Notes (Signed)
Patient ID: Marilyn [name], female   DOB: [birth year], 68 y.o.   MRN: [id number]   Subjective:    Patient ID: Marilyn [name], female    DOB: [birth year], 68 y.o.   MRN: [id number]  Patient presents for Chest congestion, coug, fatigue, runny nose  Patient here with cough with mild production fatigue is worse in the past 5 days. She's been taking over-the-counter Coricidin cough medicine with minimal improvement. She is unable sleep at night. She has not had any significant fever. Denies shortness of breath. She states that occasionally she hears herself wheeze   Review Of Systems:  GEN- denies fatigue, fever, weight loss,weakness, recent illness HEENT- denies eye drainage, change in vision,+ nasal discharge, CVS- denies chest pain, palpitations RESP- denies SOB, +cough, +wheeze ABD- denies N/V, change in stools, abd pain GU- denies dysuria, hematuria, dribbling, incontinence MSK- denies joint pain, muscle aches, injury Neuro- denies headache, dizziness, syncope, seizure activity       Objective:    BP 140/82 mmHg  Pulse 84  Temp(Src) 98.5 F (36.9 C) (Oral)  Resp 14  Ht 5' 7.5" (1.715 m)  Wt 160 lb (72.576 kg)  BMI 24.68 kg/m2 GEN- NAD, alert and oriented x3 HEENT- PERRL, EOMI, non injected sclera, pink conjunctiva, MMM, oropharynx non injection, TM clear bilat no effusion, Mild  maxillary sinus tenderness, nares clear Neck- Supple, no LAD CVS- RRR, no murmur RESP-CTAB EXT- No edema Pulses- Radial 2+         Assessment & Plan:      Problem List Items Addressed This Visit    None    Visit Diagnoses    Acute URI    -  Primary    Worsening symptoms cough med with codiene, zpak    Relevant Medications       azithromycin (ZITHROMAX) tablet       Note: This dictation was prepared with Dragon dictation along with smaller phrase technology. Any transcriptional errors that result from this process are unintentional.

## 2014-11-13 NOTE — Patient Instructions (Signed)
Take cough meds as prescribed, antibiotics F/U as needed

## 2014-11-29 ENCOUNTER — Ambulatory Visit (INDEPENDENT_AMBULATORY_CARE_PROVIDER_SITE_OTHER): Payer: Commercial Managed Care - HMO | Admitting: Family Medicine

## 2014-11-29 ENCOUNTER — Encounter: Payer: Self-pay | Admitting: Family Medicine

## 2014-11-29 ENCOUNTER — Ambulatory Visit
Admission: RE | Admit: 2014-11-29 | Discharge: 2014-11-29 | Disposition: A | Discharge status: Home / Self Care | Payer: Commercial Managed Care - HMO | Source: Ambulatory Visit | Attending: Family Medicine | Admitting: Family Medicine

## 2014-11-29 ENCOUNTER — Other Ambulatory Visit: Payer: Self-pay | Admitting: Family Medicine

## 2014-11-29 VITALS — BP 130/68 | HR 68 | Temp 97.9°F | Resp 16 | Ht 68.0 in | Wt 160.0 lb

## 2014-11-29 DIAGNOSIS — J189 Pneumonia, unspecified organism: Secondary | ICD-10-CM

## 2014-11-29 DIAGNOSIS — J398 Other specified diseases of upper respiratory tract: Secondary | ICD-10-CM

## 2014-11-29 DIAGNOSIS — J209 Acute bronchitis, unspecified: Secondary | ICD-10-CM

## 2014-11-29 MED ORDER — LEVOFLOXACIN 500 MG PO TABS: 500.0000 mg | ORAL_TABLET | Freq: Every day | ORAL | Status: DC

## 2014-11-29 MED ORDER — GUAIFENESIN-CODEINE 100-10 MG/5ML PO SOLN: ORAL | Status: DC

## 2014-11-29 NOTE — Progress Notes (Signed)
Patient ID: Marilyn [name], female   DOB: August 05, 1946, 68 y.o.   MRN: [id number]   Subjective:    Patient ID: Marilyn [name], female    DOB: 1946-09-03, 68 y.o.   MRN: [id number]  Patient presents for Cough  Patient here with ongoing cough with mild production. She was treated for acute upper respiratory infection about 3 weeks ago at that time she took a Z-Pak was given cough medicine her cough did improve and she was down to just taking the cough medicine at bed time however the past couple days it has worsened. She's not had any fever she felt a little tightness in her chest with the cough therefore that made her come in. She has not had any significant sinus drainage.   Review Of Systems:  GEN- +fatigue, fever, weight loss,weakness, recent illness HEENT- denies eye drainage, change in vision, nasal discharge, CVS- denies chest pain, palpitations RESP- denies SOB, +cough, wheeze Neuro- denies headache, dizziness, syncope, seizure activity       Objective:    BP 130/68 mmHg  Pulse 68  Temp(Src) 97.9 F (36.6 C) (Oral)  Resp 16  Ht 5\' 8"  (1.727 m)  Wt 160 lb (72.576 kg)  BMI 24.33 kg/m2  SpO2 99% GEN- NAD, alert and oriented x3, non toxic appearing HEENT- PERRL, EOMI, non injected sclera, pink conjunctiva, MMM, oropharynx clear Neck- Supple, no LAD CVS- RRR, no murmur RESP-rales right side of chest middle lobe, normal WOB, no wheeze, no retractions EXT- No edema Pulses- Radial 2+        Assessment & Plan:      Problem List Items Addressed This Visit    None    Visit Diagnoses    Acute bronchitis, unspecified organism    -  Primary    She defintely has bronchitis at this point, CXR done to evaluate for superimposed PNA, will send in cough syrup as well, oxygen sat normal, no distress    Relevant Orders       DG Chest 2 View (Completed)    CAP (community acquired pneumonia)        CXR shows blunting possible infiltrate vs atelctasis or lung collapse, also tracheal  deviation which looks positional, Will treat with levaquin, CT of chest in 2 weeks to re-evaluate ,        Note: This dictation was prepared with Dragon dictation along with smaller phrase technology. Any transcriptional errors that result from this process are unintentional.

## 2014-11-29 NOTE — Patient Instructions (Signed)
[place]  Get chest xray  F/U pending results

## 2014-12-04 ENCOUNTER — Telehealth: Payer: Self-pay | Admitting: *Deleted

## 2014-12-04 NOTE — Telephone Encounter (Signed)
Pt has appointment at [facility] on Thursday Dec 10 at 11:00 am for Blood work and 12:00 pm for exam, lmtrc to pt

## 2014-12-05 NOTE — Telephone Encounter (Signed)
Pt called back and aware of appointment but states can not make that appointment d/t in [facility], pt is aware needs to call herself to cancell and reschedule appt

## 2014-12-05 NOTE — Telephone Encounter (Signed)
[place] on both home and cell number

## 2014-12-07 ENCOUNTER — Other Ambulatory Visit: Payer: Commercial Managed Care - HMO

## 2014-12-11 ENCOUNTER — Ambulatory Visit
Admission: RE | Admit: 2014-12-11 | Discharge: 2014-12-11 | Disposition: A | Discharge status: Home / Self Care | Payer: Commercial Managed Care - HMO | Source: Ambulatory Visit | Attending: Family Medicine | Admitting: Family Medicine

## 2014-12-11 DIAGNOSIS — J189 Pneumonia, unspecified organism: Secondary | ICD-10-CM

## 2014-12-11 DIAGNOSIS — J398 Other specified diseases of upper respiratory tract: Secondary | ICD-10-CM

## 2014-12-11 MED ORDER — IOHEXOL 300 MG/ML  SOLN
75.0000 mL | Freq: Once | INTRAMUSCULAR | Status: AC | PRN
  Administered 2014-12-11: 75 mL via INTRAVENOUS

## 2014-12-12 ENCOUNTER — Other Ambulatory Visit: Payer: Self-pay | Admitting: Family Medicine

## 2014-12-13 NOTE — Telephone Encounter (Signed)
Refill appropriate and filled per protocol. 

## 2015-01-29 ENCOUNTER — Telehealth: Payer: Self-pay | Admitting: Family Medicine

## 2015-01-29 DIAGNOSIS — Z1283 Encounter for screening for malignant neoplasm of skin: Secondary | ICD-10-CM

## 2015-01-29 NOTE — Telephone Encounter (Signed)
Patient is calling to say that she needs referral for her dermatologist  Please call her at [phone]

## 2015-01-31 NOTE — Telephone Encounter (Signed)
Referral ordered

## 2015-02-01 ENCOUNTER — Telehealth: Payer: Self-pay | Admitting: *Deleted

## 2015-02-01 NOTE — Telephone Encounter (Signed)
Submitted humana referral thru acuity connect for authorization to Dr. Druscilla Brownie Dermatologist with authorization number [id number]  Dx: Z12.83- Encounter for screening for malignant neoplasm of skin   Number of visits:6  Start Date: 02/22/15-expires on Date 08/21/2015  Faxed Authorization to [facility] for records

## 2015-03-14 ENCOUNTER — Other Ambulatory Visit: Payer: Self-pay

## 2015-04-18 ENCOUNTER — Encounter: Payer: Self-pay | Admitting: Internal Medicine

## 2015-06-14 ENCOUNTER — Other Ambulatory Visit: Payer: Commercial Managed Care - HMO

## 2015-06-14 DIAGNOSIS — Z0000 Encounter for general adult medical examination without abnormal findings: Secondary | ICD-10-CM

## 2015-06-14 DIAGNOSIS — I10 Essential (primary) hypertension: Secondary | ICD-10-CM

## 2015-06-14 DIAGNOSIS — E039 Hypothyroidism, unspecified: Secondary | ICD-10-CM

## 2015-06-14 LAB — CBC WITH DIFFERENTIAL/PLATELET
BASOS PCT: 1 % (ref 0–1)
Basophils Absolute: 0 10*3/uL (ref 0.0–0.1)
Eosinophils Absolute: 0.1 10*3/uL (ref 0.0–0.7)
Eosinophils Relative: 3 % (ref 0–5)
HEMATOCRIT: 39.5 % (ref 36.0–46.0)
Hemoglobin: 13.3 g/dL (ref 12.0–15.0)
Lymphocytes Relative: 36 % (ref 12–46)
Lymphs Abs: 1.8 10*3/uL (ref 0.7–4.0)
MCH: 31.1 pg (ref 26.0–34.0)
MCHC: 33.7 g/dL (ref 30.0–36.0)
MCV: 92.5 fL (ref 78.0–100.0)
MPV: 9.6 fL (ref 8.6–12.4)
Monocytes Absolute: 0.6 10*3/uL (ref 0.1–1.0)
Monocytes Relative: 12 % (ref 3–12)
NEUTROS ABS: 2.4 10*3/uL (ref 1.7–7.7)
Neutrophils Relative %: 48 % (ref 43–77)
Platelets: 301 10*3/uL (ref 150–400)
RBC: 4.27 MIL/uL (ref 3.87–5.11)
RDW: 13.2 % (ref 11.5–15.5)
WBC: 4.9 10*3/uL (ref 4.0–10.5)

## 2015-06-14 LAB — COMPLETE METABOLIC PANEL WITH GFR
ALT: 14 U/L (ref 0–35)
AST: 21 U/L (ref 0–37)
Albumin: 4 g/dL (ref 3.5–5.2)
Alkaline Phosphatase: 74 U/L (ref 39–117)
BUN: 14 mg/dL (ref 6–23)
CALCIUM: 9.4 mg/dL (ref 8.4–10.5)
CHLORIDE: 103 meq/L (ref 96–112)
CO2: 27 meq/L (ref 19–32)
CREATININE: 0.93 mg/dL (ref 0.50–1.10)
GFR, EST AFRICAN AMERICAN: 73 mL/min
GFR, EST NON AFRICAN AMERICAN: 63 mL/min
Glucose, Bld: 88 mg/dL (ref 70–99)
Potassium: 3.8 mEq/L (ref 3.5–5.3)
Sodium: 140 mEq/L (ref 135–145)
Total Bilirubin: 0.5 mg/dL (ref 0.2–1.2)
Total Protein: 6.8 g/dL (ref 6.0–8.3)

## 2015-06-14 LAB — LIPID PANEL
CHOL/HDL RATIO: 3.8 ratio
CHOLESTEROL: 149 mg/dL (ref 0–200)
HDL: 39 mg/dL — AB (ref >=46)
LDL Cholesterol: 99 mg/dL (ref 0–99)
Triglycerides: 57 mg/dL (ref <150)
VLDL: 11 mg/dL (ref 0–40)

## 2015-06-14 LAB — TSH: TSH: 0.85 u[IU]/mL (ref 0.350–4.500)

## 2015-06-21 ENCOUNTER — Encounter: Payer: Self-pay | Admitting: Family Medicine

## 2015-06-21 ENCOUNTER — Ambulatory Visit (INDEPENDENT_AMBULATORY_CARE_PROVIDER_SITE_OTHER): Payer: Commercial Managed Care - HMO | Admitting: Family Medicine

## 2015-06-21 VITALS — BP 110/70 | HR 64 | Temp 98.3°F | Resp 16 | Ht 67.5 in | Wt 154.0 lb

## 2015-06-21 DIAGNOSIS — Z0000 Encounter for general adult medical examination without abnormal findings: Secondary | ICD-10-CM | POA: Diagnosis not present

## 2015-06-21 DIAGNOSIS — E040 Nontoxic diffuse goiter: Secondary | ICD-10-CM

## 2015-06-21 DIAGNOSIS — E049 Nontoxic goiter, unspecified: Secondary | ICD-10-CM | POA: Diagnosis not present

## 2015-06-21 NOTE — Progress Notes (Signed)
Subjective:    Patient ID: Marilyn [name], female    DOB: 28-Sep-1946, 69 y.o.   MRN: [id number]  HPI Patient is here today for complete physical exam. She sees her gynecologist for a Pap smear. Her last Pap smear was 2 years ago. She recently had a mammogram which was normal. Her colonoscopy was in 2013 and was normal. She's had a tetanus shot and a pneumonia vaccine Pneumovax 23. She is due for Prevnar 13 as well as the shingles vaccine. Her most recent lab work as listed below: Lab on 06/14/2015  Component Date Value Ref Range Status  . WBC 06/14/2015 4.9  4.0 - 10.5 K/uL Final  . RBC 06/14/2015 4.27  3.87 - 5.11 MIL/uL Final  . Hemoglobin 06/14/2015 13.3  12.0 - 15.0 g/dL Final  . HCT 06/14/2015 39.5  36.0 - 46.0 % Final  . MCV 06/14/2015 92.5  78.0 - 100.0 fL Final  . MCH 06/14/2015 31.1  26.0 - 34.0 pg Final  . MCHC 06/14/2015 33.7  30.0 - 36.0 g/dL Final  . RDW 06/14/2015 13.2  11.5 - 15.5 % Final  . Platelets 06/14/2015 301  150 - 400 K/uL Final  . MPV 06/14/2015 9.6  8.6 - 12.4 fL Final  . Neutrophils Relative % 06/14/2015 48  43 - 77 % Final  . Neutro Abs 06/14/2015 2.4  1.7 - 7.7 K/uL Final  . Lymphocytes Relative 06/14/2015 36  12 - 46 % Final  . Lymphs Abs 06/14/2015 1.8  0.7 - 4.0 K/uL Final  . Monocytes Relative 06/14/2015 12  3 - 12 % Final  . Monocytes Absolute 06/14/2015 0.6  0.1 - 1.0 K/uL Final  . Eosinophils Relative 06/14/2015 3  0 - 5 % Final  . Eosinophils Absolute 06/14/2015 0.1  0.0 - 0.7 K/uL Final  . Basophils Relative 06/14/2015 1  0 - 1 % Final  . Basophils Absolute 06/14/2015 0.0  0.0 - 0.1 K/uL Final  . Smear Review 06/14/2015 Criteria for review not met   Final  . Sodium 06/14/2015 140  135 - 145 mEq/L Final  . Potassium 06/14/2015 3.8  3.5 - 5.3 mEq/L Final  . Chloride 06/14/2015 103  96 - 112 mEq/L Final  . CO2 06/14/2015 27  19 - 32 mEq/L Final  . Glucose, Bld 06/14/2015 88  70 - 99 mg/dL Final  . BUN 06/14/2015 14  6 - 23 mg/dL Final  . Creat  06/14/2015 0.93  0.50 - 1.10 mg/dL Final  . Total Bilirubin 06/14/2015 0.5  0.2 - 1.2 mg/dL Final  . Alkaline Phosphatase 06/14/2015 74  39 - 117 U/L Final  . AST 06/14/2015 21  0 - 37 U/L Final  . ALT 06/14/2015 14  0 - 35 U/L Final  . Total Protein 06/14/2015 6.8  6.0 - 8.3 g/dL Final  . Albumin 06/14/2015 4.0  3.5 - 5.2 g/dL Final  . Calcium 06/14/2015 9.4  8.4 - 10.5 mg/dL Final  . GFR, Est African American 06/14/2015 73   Final  . GFR, Est Non African American 06/14/2015 63   Final   Comment:   The estimated GFR is a calculation valid for adults (>=7 years old) that uses the CKD-EPI algorithm to adjust for age and sex. It is   not to be used for children, pregnant women, hospitalized patients,    patients on dialysis, or with rapidly changing kidney function. According to the NKDEP, eGFR >89 is normal, 60-89 shows mild impairment, 30-59 shows moderate  impairment, 15-29 shows severe impairment and <15 is ESRD.     Marland Kitchen Cholesterol 06/14/2015 149  0 - 200 mg/dL Final   Comment: ATP III Classification:       < 200        mg/dL        Desirable      200 - 239     mg/dL        Borderline High      >= 240        mg/dL        High     . Triglycerides 06/14/2015 57  <150 mg/dL Final  . HDL 06/14/2015 39* >=46 mg/dL Final   ** Please note change in reference range(s). **  . Total CHOL/HDL Ratio 06/14/2015 3.8   Final  . VLDL 06/14/2015 11  0 - 40 mg/dL Final  . LDL Cholesterol 06/14/2015 99  0 - 99 mg/dL Final   Comment:   Total Cholesterol/HDL Ratio:CHD Risk                        Coronary Heart Disease Risk Table                                        Men       Women          1/2 Average Risk              3.4        3.3              Average Risk              5.0        4.4           2X Average Risk              9.6        7.1           3X Average Risk             23.4       11.0 Use the calculated Patient Ratio above and the CHD Risk table  to determine the patient's CHD  Risk. ATP III Classification (LDL):       < 100        mg/dL         Optimal      100 - 129     mg/dL         Near or Above Optimal      130 - 159     mg/dL         Borderline High      160 - 189     mg/dL         High       > 190        mg/dL         Very High     . TSH 06/14/2015 0.850  0.350 - 4.500 uIU/mL Final   Patient reports a change in her voice. She also occasionally reports trouble swallowing. Patient had a CT scan of her chest in December which showed an enlarging goiter actually deviating the trachea to the right. Today on examination she does have a diffuse goiter although I cannot appreciate any nodularity. Past Medical History  Diagnosis Date  .  Thyroid disease   . Osteoporosis   . Bulging disc   . Hypertension   . Rosacea    Past Surgical History  Procedure Laterality Date  . Tubal ligation    . Bilateral temporomandibular joint arthroplasty     Current Outpatient Prescriptions on File Prior to Visit  Medication Sig Dispense Refill  . aspirin 81 MG tablet Take 81 mg by mouth daily.    . B Complex-Biotin-FA (B-COMPLEX PO) Take by mouth daily.    . benazepril (LOTENSIN) 20 MG tablet TAKE 1 TABLET EVERY DAY 90 tablet 1  . BIOTIN PO Take 1 tablet by mouth daily.    . calcium carbonate (OS-CAL) 600 MG TABS Take 600 mg by mouth daily.    . Cholecalciferol (VITAMIN D) 2000 UNITS tablet Take 2,000 Units by mouth daily.    . Fish Oil-Cholecalciferol (FISH OIL + D3 PO) Take by mouth. Fish Oil 1200 mg/ D3 2000 IU  Take one daily    . Glucosamine-Chondroitin (COSAMIN DS PO) Take by mouth daily. Take 1 pill on odd days and 2 pills on even days    . levothyroxine (SYNTHROID, LEVOTHROID) 75 MCG tablet Take 75 mcg by mouth daily.    . vitamin E (VITAMIN E) 1000 UNIT capsule Take 1,000 Units by mouth daily.     No current facility-administered medications on file prior to visit.   No Known Allergies History   Social History  . Marital Status: Married    Spouse Name: N/A   . Number of Children: N/A  . Years of Education: N/A   Occupational History  . Not on file.   Social History Main Topics  . Smoking status: Former Smoker    Types: Cigarettes    Quit date: 02/24/1992  . Smokeless tobacco: Never Used  . Alcohol Use: No  . Drug Use: No  . Sexual Activity: Not on file   Other Topics Concern  . Not on file   Social History Narrative   Family History  Problem Relation Age of Onset  . Colon cancer Neg Hx       Review of Systems  All other systems reviewed and are negative.      Objective:   Physical Exam  Constitutional: She is oriented to person, place, and time. She appears well-developed and well-nourished. No distress.  HENT:  Head: Normocephalic and atraumatic.  Right Ear: External ear normal.  Left Ear: External ear normal.  Nose: Nose normal.  Mouth/Throat: Oropharynx is clear and moist. No oropharyngeal exudate.  Eyes: Conjunctivae and EOM are normal. Pupils are equal, round, and reactive to light. Right eye exhibits no discharge. Left eye exhibits no discharge. No scleral icterus.  Neck: Normal range of motion. Neck supple. No JVD present. No tracheal deviation present. Thyromegaly present.  Cardiovascular: Normal rate, regular rhythm, normal heart sounds and intact distal pulses.  Exam reveals no gallop and no friction rub.   No murmur heard. Pulmonary/Chest: Effort normal and breath sounds normal. No stridor. No respiratory distress. She has no wheezes. She has no rales. She exhibits no tenderness.  Abdominal: Soft. Bowel sounds are normal. She exhibits no distension and no mass. There is no tenderness. There is no rebound and no guarding.  Musculoskeletal: Normal range of motion. She exhibits no edema or tenderness.  Lymphadenopathy:    She has no cervical adenopathy.  Neurological: She is alert and oriented to person, place, and time. She has normal reflexes. She displays normal reflexes. No cranial nerve deficit.  She  exhibits normal muscle tone. Coordination normal.  Skin: Skin is warm. No rash noted. She is not diaphoretic. No erythema. No pallor.  Psychiatric: She has a normal mood and affect. Her behavior is normal. Judgment and thought content normal.  Vitals reviewed.         Assessment & Plan:  Routine general medical examination at a health care facility  Goiter diffuse - Plan: US Soft Tissue Head/Neck  Patient's physical exam is completely normal outside of the thyromegaly. I'm going to repeat an ultrasound of the neck to determine if the patient has diffuse goiter enlargement or to determine if there is a nodule causing the enlargement of her thyroid gland. Her blood pressure is excellent. Her cholesterol is excellent. Her TSH is within normal limits. The remainder of her lab work is excellent. Patient received Prevnar 13 today. I recommended a shingles vaccine. The rest of her immunizations are up-to-date. Her cancer screening is up-to-date.

## 2015-06-25 ENCOUNTER — Telehealth: Payer: Self-pay | Admitting: *Deleted

## 2015-06-25 NOTE — Telephone Encounter (Signed)
Submitted humana referral thru acuity connect for authorization to [facility] for [place] Head/neck with authorization number [id number]  Requesting provider:Warren T.  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

## 2015-06-26 ENCOUNTER — Ambulatory Visit
Admission: RE | Admit: 2015-06-26 | Discharge: 2015-06-26 | Disposition: A | Discharge status: Home / Self Care | Payer: Commercial Managed Care - HMO | Source: Ambulatory Visit | Attending: Family Medicine | Admitting: Family Medicine

## 2015-06-26 ENCOUNTER — Other Ambulatory Visit: Payer: Commercial Managed Care - HMO

## 2015-06-26 DIAGNOSIS — E049 Nontoxic goiter, unspecified: Secondary | ICD-10-CM

## 2015-06-26 DIAGNOSIS — E040 Nontoxic diffuse goiter: Secondary | ICD-10-CM

## 2015-11-06 ENCOUNTER — Ambulatory Visit (INDEPENDENT_AMBULATORY_CARE_PROVIDER_SITE_OTHER): Payer: Commercial Managed Care - HMO | Admitting: Family Medicine

## 2015-11-06 DIAGNOSIS — Z23 Encounter for immunization: Secondary | ICD-10-CM

## 2016-01-25 ENCOUNTER — Telehealth: Payer: Self-pay | Admitting: Family Medicine

## 2016-01-25 MED ORDER — LEVOTHYROXINE SODIUM 75 MCG PO TABS: 75.0000 ug | ORAL_TABLET | Freq: Every day | ORAL | Status: DC

## 2016-01-25 MED ORDER — BENAZEPRIL HCL 20 MG PO TABS: 20.0000 mg | ORAL_TABLET | Freq: Every day | ORAL | Status: DC

## 2016-01-25 NOTE — Telephone Encounter (Signed)
Medication called/sent to requested pharmacy  

## 2016-01-25 NOTE — Telephone Encounter (Signed)
Patient saying she has switched pharmacy to [facility] order fax number is [phone] and  Phone is [phone] She needs benazepril and levothyroxine refilled if possible

## 2016-03-20 IMAGING — US US SOFT TISSUE HEAD/NECK
1 series · 14 of 25 positions shown · non-contrast
Comparison: 03/04/2011 and earlier studies

CLINICAL DATA: Goiter. Previous FNA biopsy of dominant left lesion
03/17/2006, and 04/08/2002.

EXAM:
THYROID ULTRASOUND
TECHNIQUE: Ultrasound examination of the thyroid gland and adjacent soft
tissues was performed.

[Series 1: us soft tissue head/neck · 0.05mm/px · 14 of 45 slices shown]
[im 1/45]
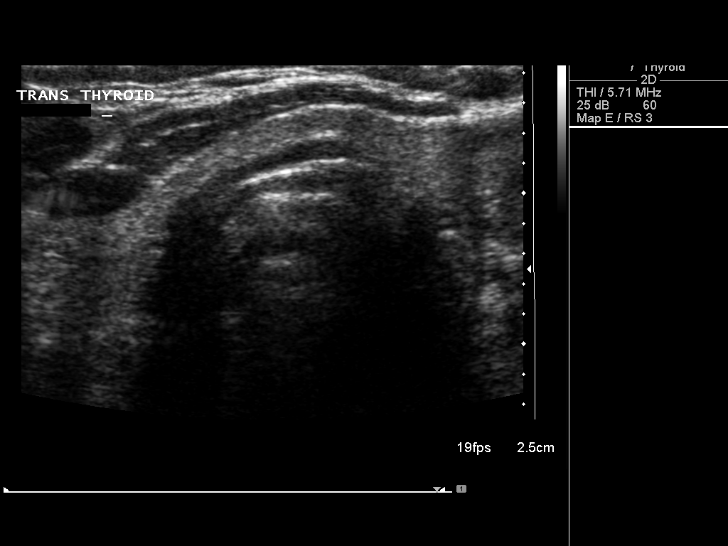
[im 4/45]
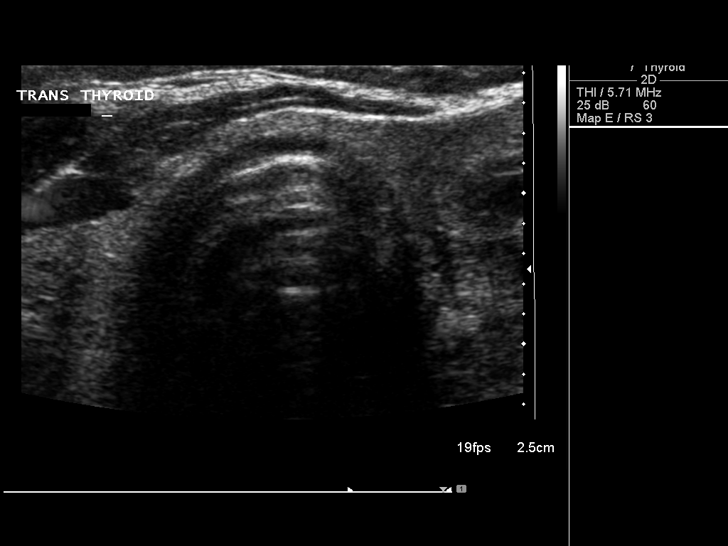
[im 8/45]
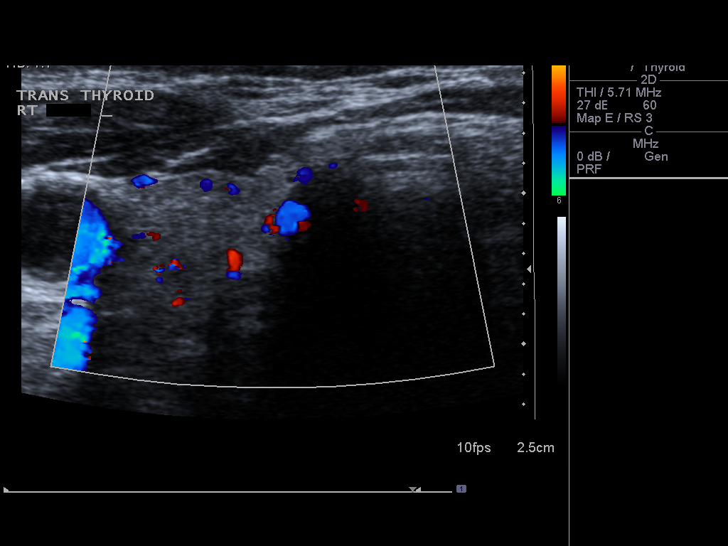
[im 12/45]
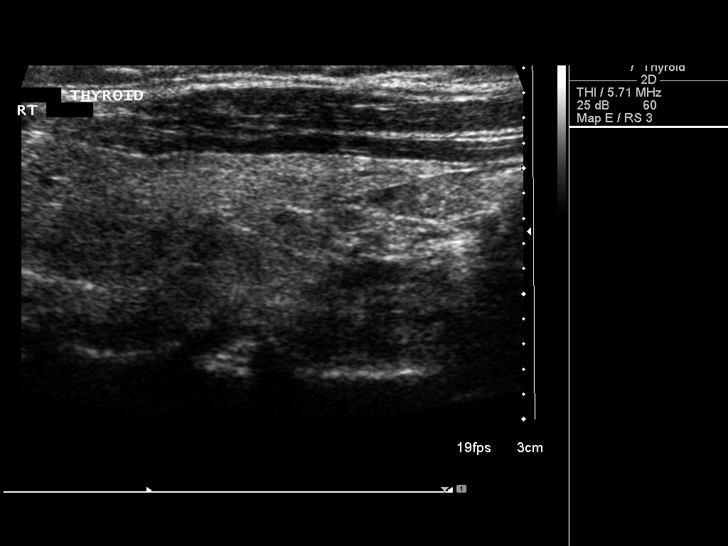
[im 15/45]
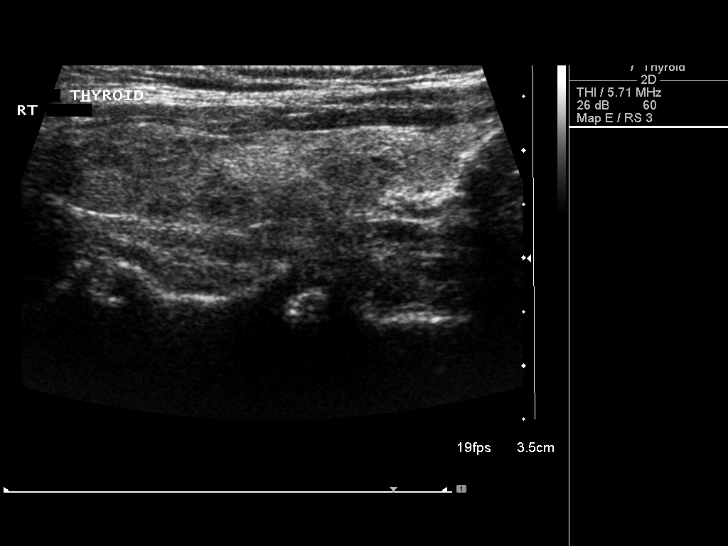
[im 17/45]
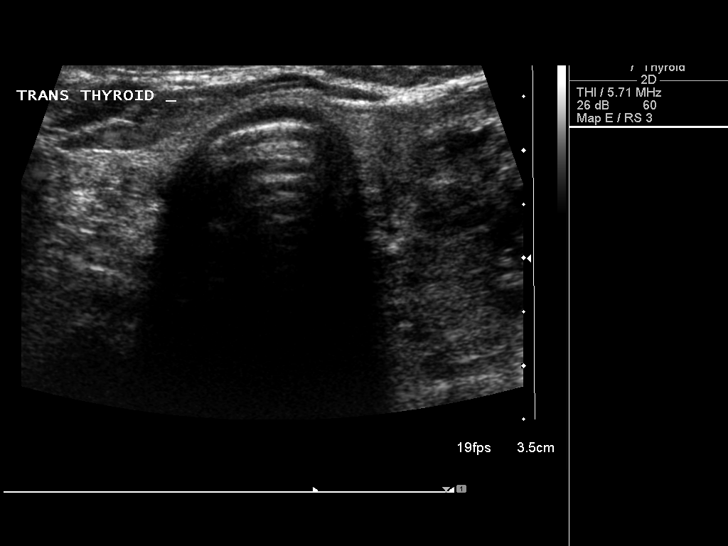
[im 21/45]
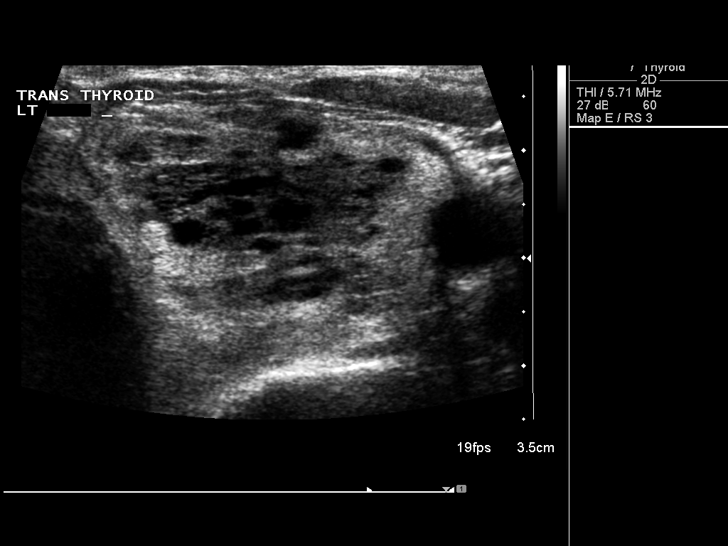
[im 24/45]
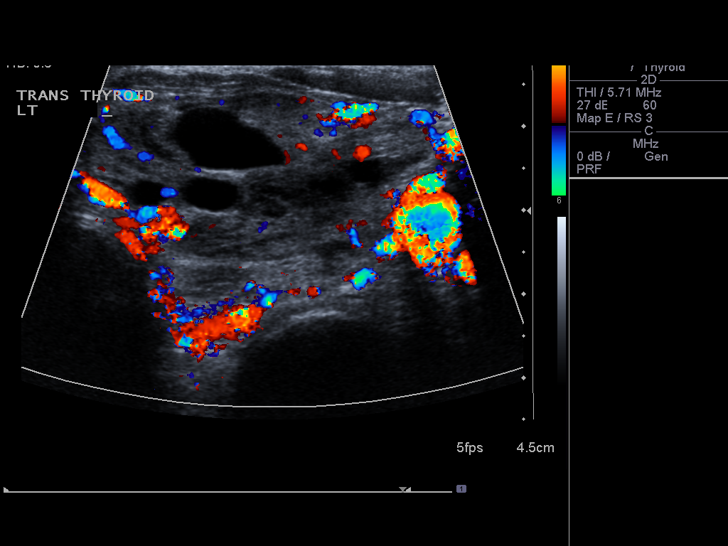
[im 28/45]
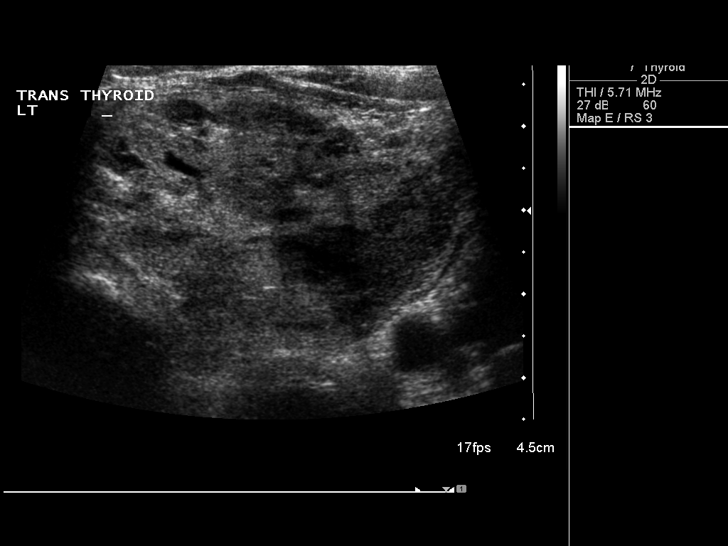
[im 30/45]
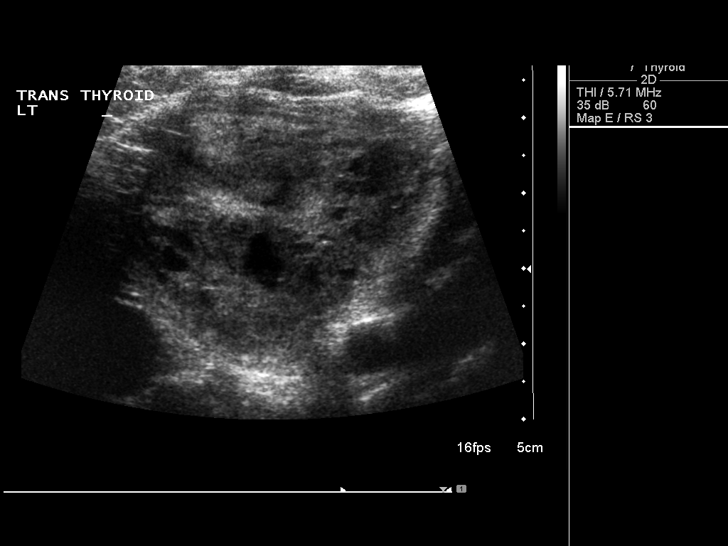
[im 34/45]
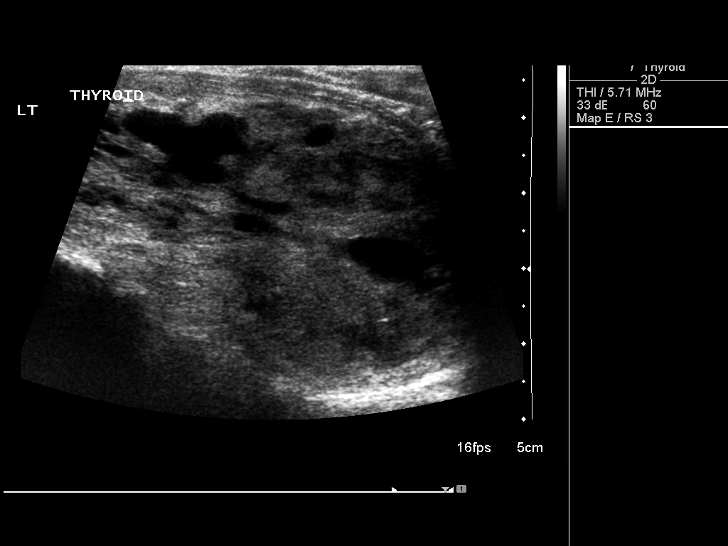
[im 37/45]
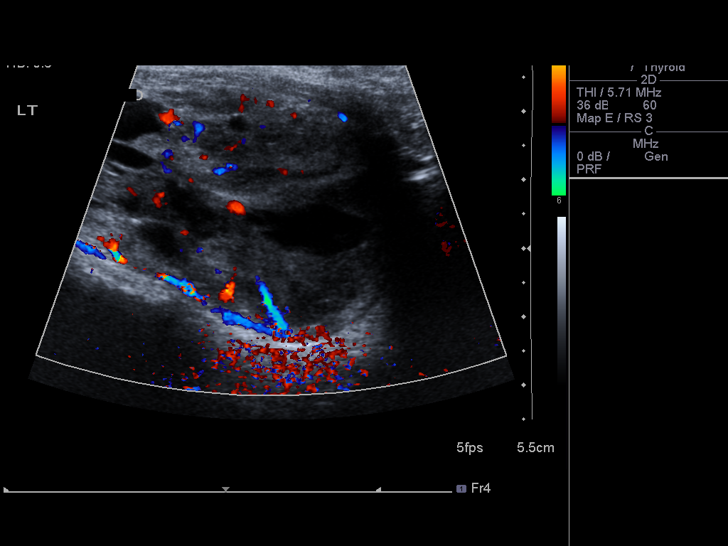
[im 41/45]
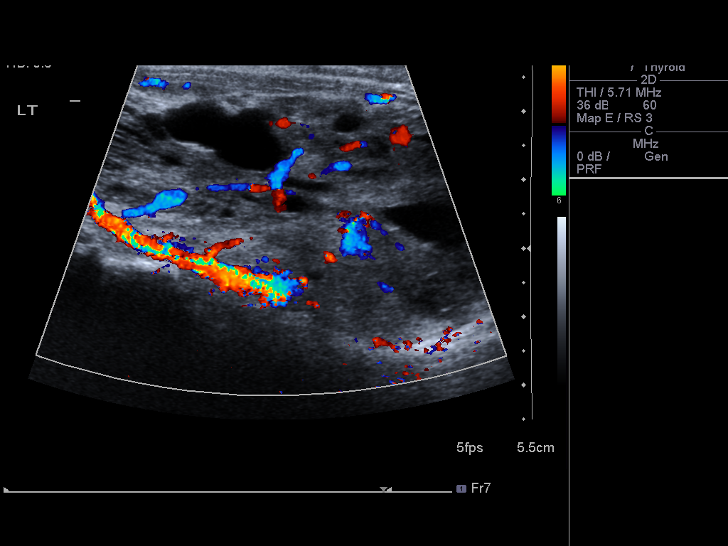
[im 45/45]
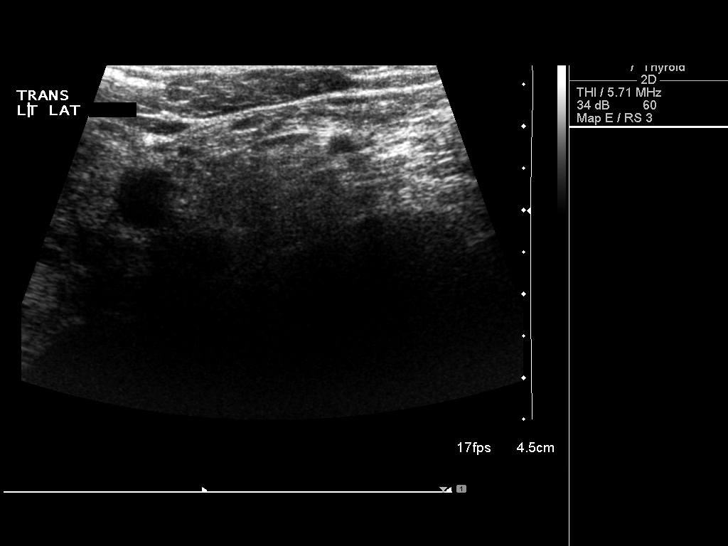

[14 of 25 positions shown; findings below may reference images not displayed]

FINDINGS: Right thyroid lobe

Measurements: 41 x 11 x 11 mm. The mildly inhomogeneous background
echotexture without focal lesion.

Left thyroid lobe

Measurements: 75 x 34 x 48 mm. There is a complex mass mostly solid
involving most of the lobe, measuring 60 x 32 x 48 mm (previously
5.8 cm).

Isthmus

Thickness: 2.1 mm.  No nodules visualized.

Lymphadenopathy

None visualized.
IMPRESSION: 1. Little if any increase in size of dominant left thyroid lesion.
Correlate with previous biopsy results.

## 2016-05-16 DIAGNOSIS — J302 Other seasonal allergic rhinitis: Secondary | ICD-10-CM | POA: Diagnosis not present

## 2016-05-16 DIAGNOSIS — J019 Acute sinusitis, unspecified: Secondary | ICD-10-CM | POA: Diagnosis not present

## 2016-05-16 DIAGNOSIS — B9689 Other specified bacterial agents as the cause of diseases classified elsewhere: Secondary | ICD-10-CM | POA: Diagnosis not present

## 2016-05-16 DIAGNOSIS — R0602 Shortness of breath: Secondary | ICD-10-CM | POA: Diagnosis not present

## 2016-05-23 DIAGNOSIS — J019 Acute sinusitis, unspecified: Secondary | ICD-10-CM | POA: Diagnosis not present

## 2016-05-23 DIAGNOSIS — B9689 Other specified bacterial agents as the cause of diseases classified elsewhere: Secondary | ICD-10-CM | POA: Diagnosis not present

## 2016-05-23 DIAGNOSIS — J3089 Other allergic rhinitis: Secondary | ICD-10-CM | POA: Diagnosis not present

## 2016-06-11 DIAGNOSIS — R319 Hematuria, unspecified: Secondary | ICD-10-CM | POA: Diagnosis not present

## 2016-06-11 DIAGNOSIS — Z6825 Body mass index (BMI) 25.0-25.9, adult: Secondary | ICD-10-CM | POA: Diagnosis not present

## 2016-06-11 DIAGNOSIS — Z1231 Encounter for screening mammogram for malignant neoplasm of breast: Secondary | ICD-10-CM | POA: Diagnosis not present

## 2016-06-11 DIAGNOSIS — Z124 Encounter for screening for malignant neoplasm of cervix: Secondary | ICD-10-CM | POA: Diagnosis not present

## 2016-06-26 ENCOUNTER — Ambulatory Visit (INDEPENDENT_AMBULATORY_CARE_PROVIDER_SITE_OTHER): Payer: Medicare HMO | Admitting: Physician Assistant

## 2016-06-26 ENCOUNTER — Encounter: Payer: Self-pay | Admitting: Physician Assistant

## 2016-06-26 VITALS — BP 124/62 | HR 70 | Temp 98.1°F | Resp 16 | Ht 67.5 in | Wt 162.0 lb

## 2016-06-26 DIAGNOSIS — J988 Other specified respiratory disorders: Secondary | ICD-10-CM

## 2016-06-26 DIAGNOSIS — B9689 Other specified bacterial agents as the cause of diseases classified elsewhere: Principal | ICD-10-CM

## 2016-06-26 MED ORDER — AZITHROMYCIN 250 MG PO TABS: ORAL_TABLET | ORAL | Status: DC

## 2016-06-26 NOTE — Progress Notes (Signed)
    Patient ID: Marilyn [name] MRN: [id number], DOB: 1946-04-21, 70 y.o. Date of Encounter: 06/26/2016, 11:16 AM    Chief Complaint:  Chief Complaint  Patient presents with  . OTHER    productive cough, nasal congestion, headaches x 1 week      HPI: 70 y.o. year old white female states that symptoms started with congestion in her head and nose. However now having scratchy throat and chest congestion as well. Started taking Xyzal last week. Thought it was allergies. However symptoms are only getting worse. No significant amount of sore throat. No ear ache. No fevers or chills.     Home Meds:   Outpatient Prescriptions Prior to Visit  Medication Sig Dispense Refill  . aspirin 81 MG tablet Take 81 mg by mouth daily.    . B Complex-Biotin-FA (B-COMPLEX PO) Take by mouth daily.    . benazepril (LOTENSIN) 20 MG tablet Take 1 tablet (20 mg total) by mouth daily. 90 tablet 4  . BIOTIN PO Take 1 tablet by mouth daily.    . calcium carbonate (OS-CAL) 600 MG TABS Take 600 mg by mouth daily.    . Cholecalciferol (VITAMIN D) 2000 UNITS tablet Take 2,000 Units by mouth daily.    . Fish Oil-Cholecalciferol (FISH OIL + D3 PO) Take by mouth. Fish Oil 1200 mg/ D3 2000 IU  Take one daily    . Glucosamine-Chondroitin (COSAMIN DS PO) Take by mouth daily. Take 1 pill on odd days and 2 pills on even days    . levothyroxine (SYNTHROID, LEVOTHROID) 75 MCG tablet Take 1 tablet (75 mcg total) by mouth daily. 90 tablet 4  . vitamin E (VITAMIN E) 1000 UNIT capsule Take 1,000 Units by mouth daily.     No facility-administered medications prior to visit.    Allergies: No Known Allergies    Review of Systems: See HPI for pertinent ROS. All other ROS negative.    Physical Exam: Blood pressure 124/62, pulse 70, temperature 98.1 F (36.7 C), temperature source Oral, resp. rate 16, height 5' 7.5" (1.715 m), weight 162 lb (73.483 kg)., Body mass index is 24.98 kg/(m^2). General:  WNWD WF. Appears in no acute  distress. HEENT: Normocephalic, atraumatic, eyes without discharge, sclera non-icteric, nares are without discharge. Bilateral auditory canals clear, TM's are without perforation, pearly grey and translucent with reflective cone of light bilaterally. Oral cavity moist, posterior pharynx without exudate, erythema, peritonsillar abscess.  Neck: Supple. No thyromegaly. No lymphadenopathy. Lungs: Clear bilaterally to auscultation without wheezes, rales, or rhonchi. Breathing is unlabored. Heart: Regular rhythm. No murmurs, rubs, or gallops. Msk:  Strength and tone normal for age. Extremities/Skin: Warm and dry. No rashes. Neuro: Alert and oriented X 3. Moves all extremities spontaneously. Gait is normal. CNII-XII grossly in tact. Psych:  Responds to questions appropriately with a normal affect.     ASSESSMENT AND PLAN:  70 y.o. year old female with  1. Bacterial respiratory infection Take antibiotic as directed. Also can use over-the-counter decongestants and cough medications as needed for symptom relief. Follow-up if symptoms do not resolve within 1 week after completion of antibiotic. - azithromycin (ZITHROMAX) 250 MG tablet; Day 1: Take 2 daily. Days 2-5: Take 1 daily.  Dispense: 6 tablet; Refill: 0   Signed, [address] [place], [place], [facility] 06/26/2016 11:16 AM

## 2016-08-05 DIAGNOSIS — M1712 Unilateral primary osteoarthritis, left knee: Secondary | ICD-10-CM | POA: Diagnosis not present

## 2016-08-20 ENCOUNTER — Other Ambulatory Visit: Payer: Medicare HMO

## 2016-08-20 DIAGNOSIS — Z79899 Other long term (current) drug therapy: Secondary | ICD-10-CM | POA: Diagnosis not present

## 2016-08-20 DIAGNOSIS — Z0000 Encounter for general adult medical examination without abnormal findings: Secondary | ICD-10-CM

## 2016-08-20 DIAGNOSIS — I10 Essential (primary) hypertension: Secondary | ICD-10-CM | POA: Diagnosis not present

## 2016-08-20 DIAGNOSIS — E785 Hyperlipidemia, unspecified: Secondary | ICD-10-CM

## 2016-08-21 LAB — COMPLETE METABOLIC PANEL WITH GFR
ALT: 8 U/L (ref 6–29)
AST: 16 U/L (ref 10–35)
Albumin: 4 g/dL (ref 3.6–5.1)
Alkaline Phosphatase: 58 U/L (ref 33–130)
BILIRUBIN TOTAL: 0.4 mg/dL (ref 0.2–1.2)
BUN: 16 mg/dL (ref 7–25)
CO2: 26 mmol/L (ref 20–31)
Calcium: 9.2 mg/dL (ref 8.6–10.4)
Chloride: 101 mmol/L (ref 98–110)
Creat: 0.94 mg/dL (ref 0.50–0.99)
GFR, Est African American: 72 mL/min (ref >=60)
GFR, Est Non African American: 62 mL/min (ref >=60)
GLUCOSE: 113 mg/dL — AB (ref 70–99)
Potassium: 4.4 mmol/L (ref 3.5–5.3)
SODIUM: 139 mmol/L (ref 135–146)
TOTAL PROTEIN: 6.7 g/dL (ref 6.1–8.1)

## 2016-08-21 LAB — CBC WITH DIFFERENTIAL/PLATELET
Basophils Absolute: 0 cells/uL (ref 0–200)
Basophils Relative: 0 %
Eosinophils Absolute: 118 cells/uL (ref 15–500)
Eosinophils Relative: 2 %
HCT: 36.9 % (ref 35.0–45.0)
Hemoglobin: 11.9 g/dL — ABNORMAL LOW (ref 12.0–15.0)
Lymphocytes Relative: 32 %
Lymphs Abs: 1888 cells/uL (ref 850–3900)
MCH: 30.8 pg (ref 27.0–33.0)
MCHC: 32.2 g/dL (ref 32.0–36.0)
MCV: 95.6 fL (ref 80.0–100.0)
MPV: 9.5 fL (ref 7.5–12.5)
Monocytes Absolute: 472 cells/uL (ref 200–950)
Monocytes Relative: 8 %
Neutro Abs: 3422 cells/uL (ref 1500–7800)
Neutrophils Relative %: 58 %
Platelets: 317 10*3/uL (ref 140–400)
RBC: 3.86 MIL/uL (ref 3.80–5.10)
RDW: 13.4 % (ref 11.0–15.0)
WBC: 5.9 10*3/uL (ref 3.8–10.8)

## 2016-08-21 LAB — LIPID PANEL
Cholesterol: 203 mg/dL — ABNORMAL HIGH (ref 125–200)
HDL: 64 mg/dL (ref >=46)
LDL Cholesterol: 126 mg/dL (ref <130)
Total CHOL/HDL Ratio: 3.2 Ratio (ref <=5.0)
Triglycerides: 64 mg/dL (ref <150)
VLDL: 13 mg/dL (ref <30)

## 2016-08-21 LAB — TSH: TSH: 0.61 mIU/L

## 2016-08-22 ENCOUNTER — Encounter: Payer: Self-pay | Admitting: Family Medicine

## 2016-08-22 ENCOUNTER — Ambulatory Visit (INDEPENDENT_AMBULATORY_CARE_PROVIDER_SITE_OTHER): Payer: Medicare HMO | Admitting: Family Medicine

## 2016-08-22 VITALS — BP 134/62 | HR 68 | Temp 98.3°F | Resp 14 | Ht 67.5 in | Wt 165.0 lb

## 2016-08-22 DIAGNOSIS — Z0000 Encounter for general adult medical examination without abnormal findings: Secondary | ICD-10-CM

## 2016-08-22 NOTE — Progress Notes (Signed)
Subjective:    Patient ID: Marilyn [name], female    DOB: 1946/12/08, 70 y.o.   MRN: [id number]  HPI Patient is here today for complete physical exam. She sees her gynecologist for a Pap smear. Her last Pap smear was 2 years ago. She recently had a mammogram which was normal. Her colonoscopy was in 2013 and was normal. She's had a tetanus shot.  Pneumovax 23 and Prevnar teen up-to-date.  She has declined the shingles vaccine due to the fact she has a history of shingles.  Her most recent lab work as listed below: Lab on 08/20/2016  Component Date Value Ref Range Status  . Sodium 08/21/2016 139  135 - 146 mmol/L Final  . Potassium 08/21/2016 4.4  3.5 - 5.3 mmol/L Final  . Chloride 08/21/2016 101  98 - 110 mmol/L Final  . CO2 08/21/2016 26  20 - 31 mmol/L Final  . Glucose, Bld 08/21/2016 113* 70 - 99 mg/dL Final  . BUN 08/21/2016 16  7 - 25 mg/dL Final  . Creat 08/21/2016 0.94  0.50 - 0.99 mg/dL Final   Comment:   For patients > or = 69 years of age: The upper reference limit for Creatinine is approximately 13% higher for people identified as African-American.     . Total Bilirubin 08/21/2016 0.4  0.2 - 1.2 mg/dL Final  . Alkaline Phosphatase 08/21/2016 58  33 - 130 U/L Final  . AST 08/21/2016 16  10 - 35 U/L Final  . ALT 08/21/2016 8  6 - 29 U/L Final  . Total Protein 08/21/2016 6.7  6.1 - 8.1 g/dL Final  . Albumin 08/21/2016 4.0  3.6 - 5.1 g/dL Final  . Calcium 08/21/2016 9.2  8.6 - 10.4 mg/dL Final  . GFR, Est African American 08/21/2016 72  >=60 mL/min Final  . GFR, Est Non African American 08/21/2016 62  >=60 mL/min Final  . TSH 08/21/2016 0.61  mIU/L Final   Comment:   Reference Range   > or = 20 Years  0.40-4.50   Pregnancy Range First trimester  0.26-2.66 Second trimester 0.55-2.73 Third trimester  0.43-2.91     . Cholesterol 08/21/2016 203* 125 - 200 mg/dL Final  . Triglycerides 08/21/2016 64  <150 mg/dL Final  . HDL 08/21/2016 64  >=46 mg/dL Final  . Total  CHOL/HDL Ratio 08/21/2016 3.2  <=5.0 Ratio Final  . VLDL 08/21/2016 13  <30 mg/dL Final  . LDL Cholesterol 08/21/2016 126  <130 mg/dL Final   Comment:   Total Cholesterol/HDL Ratio:CHD Risk                        Coronary Heart Disease Risk Table                                        Men       Women          1/2 Average Risk              3.4        3.3              Average Risk              5.0        4.4           2X Average Risk  9.6        7.1           3X Average Risk             23.4       11.0 Use the calculated Patient Ratio above and the CHD Risk table  to determine the patient's CHD Risk.   . WBC 08/21/2016 5.9  3.8 - 10.8 K/uL Final  . RBC 08/21/2016 3.86  3.80 - 5.10 MIL/uL Final  . Hemoglobin 08/21/2016 11.9* 12.0 - 15.0 g/dL Final  . HCT 08/21/2016 36.9  35.0 - 45.0 % Final  . MCV 08/21/2016 95.6  80.0 - 100.0 fL Final  . MCH 08/21/2016 30.8  27.0 - 33.0 pg Final  . MCHC 08/21/2016 32.2  32.0 - 36.0 g/dL Final  . RDW 08/21/2016 13.4  11.0 - 15.0 % Final  . Platelets 08/21/2016 317  140 - 400 K/uL Final  . MPV 08/21/2016 9.5  7.5 - 12.5 fL Final  . Neutro Abs 08/21/2016 3422  1,500 - 7,800 cells/uL Final  . Lymphs Abs 08/21/2016 1888  850 - 3,900 cells/uL Final  . Monocytes Absolute 08/21/2016 472  200 - 950 cells/uL Final  . Eosinophils Absolute 08/21/2016 118  15 - 500 cells/uL Final  . Basophils Absolute 08/21/2016 0  0 - 200 cells/uL Final  . Neutrophils Relative % 08/21/2016 58  % Final  . Lymphocytes Relative 08/21/2016 32  % Final  . Monocytes Relative 08/21/2016 8  % Final  . Eosinophils Relative 08/21/2016 2  % Final  . Basophils Relative 08/21/2016 0  % Final  . Smear Review 08/21/2016 Criteria for review not met   Final    Past Medical History:  Diagnosis Date  . Bulging disc   . Hypertension   . Osteoporosis   . Rosacea   . Thyroid disease    Past Surgical History:  Procedure Laterality Date  . BILATERAL TEMPOROMANDIBULAR JOINT  ARTHROPLASTY    . TUBAL LIGATION     Current Outpatient Prescriptions on File Prior to Visit  Medication Sig Dispense Refill  . aspirin 81 MG tablet Take 81 mg by mouth daily.    . B Complex-Biotin-FA (B-COMPLEX PO) Take by mouth daily.    . benazepril (LOTENSIN) 20 MG tablet Take 1 tablet (20 mg total) by mouth daily. 90 tablet 4  . BIOTIN PO Take 1 tablet by mouth daily.    . calcium carbonate (OS-CAL) 600 MG TABS Take 600 mg by mouth daily.    . Cholecalciferol (VITAMIN D) 2000 UNITS tablet Take 2,000 Units by mouth daily.    . Fish Oil-Cholecalciferol (FISH OIL + D3 PO) Take by mouth. Fish Oil 1200 mg/ D3 2000 IU  Take one daily    . Glucosamine-Chondroitin (COSAMIN DS PO) Take by mouth daily. Take 1 pill on odd days and 2 pills on even days    . levothyroxine (SYNTHROID, LEVOTHROID) 75 MCG tablet Take 1 tablet (75 mcg total) by mouth daily. 90 tablet 4  . vitamin E (VITAMIN E) 1000 UNIT capsule Take 1,000 Units by mouth daily.     No current facility-administered medications on file prior to visit.    No Known Allergies Social History   Social History  . Marital status: Married    Spouse name: N/A  . Number of children: N/A  . Years of education: N/A   Occupational History  . Not on file.   Social History Main Topics  . Smoking status: Former  Smoker    Types: Cigarettes    Quit date: 02/24/1992  . Smokeless tobacco: Never Used  . Alcohol use No  . Drug use: No  . Sexual activity: Not on file   Other Topics Concern  . Not on file   Social History Narrative  . No narrative on file   Family History  Problem Relation Age of Onset  . Colon cancer Neg Hx       Review of Systems  All other systems reviewed and are negative.      Objective:   Physical Exam  Constitutional: She is oriented to person, place, and time. She appears well-developed and well-nourished. No distress.  HENT:  Head: Normocephalic and atraumatic.  Right Ear: External ear normal.  Left  Ear: External ear normal.  Nose: Nose normal.  Mouth/Throat: Oropharynx is clear and moist. No oropharyngeal exudate.  Eyes: Conjunctivae and EOM are normal. Pupils are equal, round, and reactive to light. Right eye exhibits no discharge. Left eye exhibits no discharge. No scleral icterus.  Neck: Normal range of motion. Neck supple. No JVD present. No tracheal deviation present. No thyromegaly present.  Cardiovascular: Normal rate, regular rhythm, normal heart sounds and intact distal pulses.  Exam reveals no gallop and no friction rub.   No murmur heard. Pulmonary/Chest: Effort normal and breath sounds normal. No stridor. No respiratory distress. She has no wheezes. She has no rales. She exhibits no tenderness.  Abdominal: Soft. Bowel sounds are normal. She exhibits no distension and no mass. There is no tenderness. There is no rebound and no guarding.  Musculoskeletal: Normal range of motion. She exhibits no edema or tenderness.  Lymphadenopathy:    She has no cervical adenopathy.  Neurological: She is alert and oriented to person, place, and time. She has normal reflexes. No cranial nerve deficit. She exhibits normal muscle tone. Coordination normal.  Skin: Skin is warm. No rash noted. She is not diaphoretic. No erythema. No pallor.  Psychiatric: She has a normal mood and affect. Her behavior is normal. Judgment and thought content normal.  Vitals reviewed.         Assessment & Plan:  Routine general medical examination at a health care facility Patient's physical exam is completely normal today. Colonoscopy is up-to-date. Mammogram is up-to-date. Pap smear is up-to-date. Immunizations are up-to-date. I did recommend a bone density test in one year given her history of osteoporosis. Her most recent lab work is excellent

## 2016-10-13 ENCOUNTER — Encounter: Payer: Self-pay | Admitting: Physician Assistant

## 2016-10-13 ENCOUNTER — Ambulatory Visit (INDEPENDENT_AMBULATORY_CARE_PROVIDER_SITE_OTHER): Payer: Medicare HMO | Admitting: Physician Assistant

## 2016-10-13 VITALS — BP 118/76 | HR 62 | Temp 97.8°F | Resp 16 | Wt 164.0 lb

## 2016-10-13 DIAGNOSIS — L309 Dermatitis, unspecified: Secondary | ICD-10-CM

## 2016-10-13 NOTE — Progress Notes (Signed)
    Patient ID: SOLAI SARA MRN: [id number], DOB: 07-09-1946, 70 y.o. Date of Encounter: 10/13/2016, 1:38 PM    Chief Complaint:  Chief Complaint  Patient presents with  . office visit    rash on left leg     HPI: 70 y.o. year old female  Presents with above.   Says that her husband just noticed this rash looking like this yesterday morning. Says that last Wednesday she noticed just a few very small spots and then had not noticed anything else about it until yesterday. Says that it does not itch and it does not hurt. She is on no new medications.     Home Meds:   Outpatient Medications Prior to Visit  Medication Sig Dispense Refill  . aspirin 81 MG tablet Take 81 mg by mouth daily.    . B Complex-Biotin-FA (B-COMPLEX PO) Take by mouth daily.    . benazepril (LOTENSIN) 20 MG tablet Take 1 tablet (20 mg total) by mouth daily. 90 tablet 4  . BIOTIN PO Take 1 tablet by mouth daily.    . calcium carbonate (OS-CAL) 600 MG TABS Take 600 mg by mouth daily.    . Cholecalciferol (VITAMIN D) 2000 UNITS tablet Take 2,000 Units by mouth daily.    . Fish Oil-Cholecalciferol (FISH OIL + D3 PO) Take by mouth. Fish Oil 1200 mg/ D3 2000 IU  Take one daily    . Glucosamine-Chondroitin (COSAMIN DS PO) Take by mouth daily. Take 1 pill on odd days and 2 pills on even days    . levothyroxine (SYNTHROID, LEVOTHROID) 75 MCG tablet Take 1 tablet (75 mcg total) by mouth daily. 90 tablet 4  . vitamin E (VITAMIN E) 1000 UNIT capsule Take 1,000 Units by mouth daily.     No facility-administered medications prior to visit.     Allergies: No Known Allergies    Review of Systems: See HPI for pertinent ROS. All other ROS negative.    Physical Exam: Blood pressure 118/76, pulse 62, temperature 97.8 F (36.6 C), resp. rate 16, weight 164 lb (74.4 kg), SpO2 98 %., Body mass index is 25.31 kg/m. General:  WNWD WF. Appears in no acute distress. Neck: Supple. No thyromegaly. No lymphadenopathy. Lungs:  Clear bilaterally to auscultation without wheezes, rales, or rhonchi. Breathing is unlabored. Heart: Regular rhythm. No murmurs, rubs, or gallops. Msk:  Strength and tone normal for age. Extremities/Skin: Medial aspect Left lower leg----area is macular. It is lacy erythema. Does not blanch. Deep red/purple color of erythema. Neuro: Alert and oriented X 3. Moves all extremities spontaneously. Gait is normal. CNII-XII grossly in tact. Psych:  Responds to questions appropriately with a normal affect.     ASSESSMENT AND PLAN:  70 y.o. year old female with  1. Dermatitis I asked patient if she done any unusual activity recently. Says that she was at [employer] and walked a lot. Also was wearing shorts. Think rash is secondary to small capillaries. Secondary to increased walking and sun exposure. Hold aspirin for 1 week. F/U  if rash worsens or does not gradually resolve over the next couple of weeks.   [address] [place], [place], [facility] 10/13/2016 1:38 PM

## 2016-10-20 DIAGNOSIS — L820 Inflamed seborrheic keratosis: Secondary | ICD-10-CM | POA: Diagnosis not present

## 2016-10-20 DIAGNOSIS — D225 Melanocytic nevi of trunk: Secondary | ICD-10-CM | POA: Diagnosis not present

## 2016-10-20 DIAGNOSIS — L814 Other melanin hyperpigmentation: Secondary | ICD-10-CM | POA: Diagnosis not present

## 2016-10-20 DIAGNOSIS — R69 Illness, unspecified: Secondary | ICD-10-CM | POA: Diagnosis not present

## 2016-10-20 DIAGNOSIS — D485 Neoplasm of uncertain behavior of skin: Secondary | ICD-10-CM | POA: Diagnosis not present

## 2016-10-20 DIAGNOSIS — L821 Other seborrheic keratosis: Secondary | ICD-10-CM | POA: Diagnosis not present

## 2016-11-06 ENCOUNTER — Ambulatory Visit (INDEPENDENT_AMBULATORY_CARE_PROVIDER_SITE_OTHER): Payer: Medicare HMO | Admitting: Family Medicine

## 2016-11-06 VITALS — BP 134/90 | HR 82 | Temp 98.3°F | Resp 14 | Ht 67.5 in | Wt 161.0 lb

## 2016-11-06 DIAGNOSIS — B9689 Other specified bacterial agents as the cause of diseases classified elsewhere: Secondary | ICD-10-CM | POA: Diagnosis not present

## 2016-11-06 DIAGNOSIS — J019 Acute sinusitis, unspecified: Secondary | ICD-10-CM | POA: Diagnosis not present

## 2016-11-06 MED ORDER — FLUCONAZOLE 150 MG PO TABS: 150.0000 mg | ORAL_TABLET | Freq: Once | ORAL | 0 refills | Status: AC

## 2016-11-06 MED ORDER — AMOXICILLIN-POT CLAVULANATE 875-125 MG PO TABS: 1.0000 | ORAL_TABLET | Freq: Two times a day (BID) | ORAL | 0 refills | Status: DC

## 2016-11-06 MED ORDER — HYDROCODONE-HOMATROPINE 5-1.5 MG/5ML PO SYRP: 5.0000 mL | ORAL_SOLUTION | Freq: Three times a day (TID) | ORAL | 0 refills | Status: DC | PRN

## 2016-11-06 NOTE — Progress Notes (Signed)
Subjective:    Patient ID: Marilyn [name], female    DOB: Jan 05, 1946, 70 y.o.   MRN: [id number]  HPI Patient has had a cold for 2 weeks. Symptoms included mild rhinorrhea, mild cough, mild head congestion. However over the last few days symptoms have intensified. She is now having severe pain in her maxillary sinuses bilaterally. Her teeth are hurting. She has a constant headache in her frontal sinuses. She is also coughing more due to postnasal drip. Past Medical History:  Diagnosis Date  . Bulging disc   . Hypertension   . Osteoporosis   . Rosacea   . Thyroid disease    Past Surgical History:  Procedure Laterality Date  . BILATERAL TEMPOROMANDIBULAR JOINT ARTHROPLASTY    . TUBAL LIGATION     Current Outpatient Prescriptions on File Prior to Visit  Medication Sig Dispense Refill  . aspirin 81 MG tablet Take 81 mg by mouth daily.    . B Complex-Biotin-FA (B-COMPLEX PO) Take by mouth daily.    . benazepril (LOTENSIN) 20 MG tablet Take 1 tablet (20 mg total) by mouth daily. 90 tablet 4  . BIOTIN PO Take 1 tablet by mouth daily.    . calcium carbonate (OS-CAL) 600 MG TABS Take 600 mg by mouth daily.    . Cholecalciferol (VITAMIN D) 2000 UNITS tablet Take 2,000 Units by mouth daily.    . Fish Oil-Cholecalciferol (FISH OIL + D3 PO) Take by mouth. Fish Oil 1200 mg/ D3 2000 IU  Take one daily    . Glucosamine-Chondroitin (COSAMIN DS PO) Take by mouth daily. Take 1 pill on odd days and 2 pills on even days    . levothyroxine (SYNTHROID, LEVOTHROID) 75 MCG tablet Take 1 tablet (75 mcg total) by mouth daily. 90 tablet 4  . vitamin E (VITAMIN E) 1000 UNIT capsule Take 1,000 Units by mouth daily.     No current facility-administered medications on file prior to visit.    No Known Allergies Social History   Social History  . Marital status: Married    Spouse name: N/A  . Number of children: N/A  . Years of education: N/A   Occupational History  . Not on file.   Social History Main  Topics  . Smoking status: Former Smoker    Types: Cigarettes    Quit date: 02/24/1992  . Smokeless tobacco: Never Used  . Alcohol use No  . Drug use: No  . Sexual activity: Not on file   Other Topics Concern  . Not on file   Social History Narrative  . No narrative on file      Review of Systems  All other systems reviewed and are negative.      Objective:   Physical Exam  HENT:  Nose: Mucosal edema and rhinorrhea present. Right sinus exhibits maxillary sinus tenderness. Left sinus exhibits maxillary sinus tenderness.  Neck: Neck supple.  Cardiovascular: Normal rate, regular rhythm and normal heart sounds.   Pulmonary/Chest: Effort normal and breath sounds normal. No respiratory distress. She has no wheezes. She has no rales.  Lymphadenopathy:    She has no cervical adenopathy.  Vitals reviewed.         Assessment & Plan:  Acute bacterial rhinosinusitis - Plan: amoxicillin-clavulanate (AUGMENTIN) 875-125 MG tablet  I believe the patient has developed a secondary sinus infection. Begin Augmentin 875 mg by mouth twice a day for 10 days. I will give her Hycodan 1 teaspoon every 8 hours as needed for cough  which I believe is due to postnasal drip and drainage. Also gave her a prescription for Diflucan in case she develops a yeast infection but she will not get the prescription unless she needs

## 2016-11-26 DIAGNOSIS — B373 Candidiasis of vulva and vagina: Secondary | ICD-10-CM | POA: Diagnosis not present

## 2016-11-26 DIAGNOSIS — N390 Urinary tract infection, site not specified: Secondary | ICD-10-CM | POA: Diagnosis not present

## 2017-02-02 DIAGNOSIS — Z0000 Encounter for general adult medical examination without abnormal findings: Secondary | ICD-10-CM | POA: Diagnosis not present

## 2017-02-02 DIAGNOSIS — Z6825 Body mass index (BMI) 25.0-25.9, adult: Secondary | ICD-10-CM | POA: Diagnosis not present

## 2017-02-02 DIAGNOSIS — I10 Essential (primary) hypertension: Secondary | ICD-10-CM | POA: Diagnosis not present

## 2017-02-02 DIAGNOSIS — E039 Hypothyroidism, unspecified: Secondary | ICD-10-CM | POA: Diagnosis not present

## 2017-02-02 DIAGNOSIS — Z7982 Long term (current) use of aspirin: Secondary | ICD-10-CM | POA: Diagnosis not present

## 2017-02-02 DIAGNOSIS — Z87891 Personal history of nicotine dependence: Secondary | ICD-10-CM | POA: Diagnosis not present

## 2017-02-02 DIAGNOSIS — J309 Allergic rhinitis, unspecified: Secondary | ICD-10-CM | POA: Diagnosis not present

## 2017-02-02 DIAGNOSIS — M179 Osteoarthritis of knee, unspecified: Secondary | ICD-10-CM | POA: Diagnosis not present

## 2017-02-13 ENCOUNTER — Other Ambulatory Visit: Payer: Self-pay | Admitting: Family Medicine

## 2017-02-13 MED ORDER — BENAZEPRIL HCL 20 MG PO TABS: 20.0000 mg | ORAL_TABLET | Freq: Every day | ORAL | 3 refills | Status: DC

## 2017-02-13 MED ORDER — LEVOTHYROXINE SODIUM 75 MCG PO TABS: 75.0000 ug | ORAL_TABLET | Freq: Every day | ORAL | 3 refills | Status: DC

## 2017-02-13 NOTE — Addendum Note (Signed)
Addended by: Sheral Flow on: 02/13/2017 02:00 PM   Modules accepted: Orders

## 2017-02-25 ENCOUNTER — Encounter: Payer: Self-pay | Admitting: Family Medicine

## 2017-02-25 ENCOUNTER — Ambulatory Visit (INDEPENDENT_AMBULATORY_CARE_PROVIDER_SITE_OTHER): Payer: Medicare HMO | Admitting: Family Medicine

## 2017-02-25 ENCOUNTER — Ambulatory Visit
Admission: RE | Admit: 2017-02-25 | Discharge: 2017-02-25 | Disposition: A | Discharge status: Home / Self Care | Payer: Medicare HMO | Source: Ambulatory Visit | Attending: Family Medicine | Admitting: Family Medicine

## 2017-02-25 VITALS — BP 130/78 | HR 68 | Temp 98.1°F | Resp 16 | Ht 67.5 in | Wt 161.0 lb

## 2017-02-25 DIAGNOSIS — E041 Nontoxic single thyroid nodule: Secondary | ICD-10-CM | POA: Diagnosis not present

## 2017-02-25 DIAGNOSIS — R7301 Impaired fasting glucose: Secondary | ICD-10-CM | POA: Diagnosis not present

## 2017-02-25 DIAGNOSIS — E039 Hypothyroidism, unspecified: Secondary | ICD-10-CM

## 2017-02-25 LAB — T3, FREE: T3 FREE: 3.3 pg/mL (ref 2.3–4.2)

## 2017-02-25 LAB — CBC WITH DIFFERENTIAL/PLATELET
BASOS ABS: 0 {cells}/uL (ref 0–200)
Basophils Relative: 0 %
EOS PCT: 3 %
Eosinophils Absolute: 198 cells/uL (ref 15–500)
HCT: 41.2 % (ref 35.0–45.0)
Hemoglobin: 13.5 g/dL (ref 12.0–15.0)
LYMPHS ABS: 1848 {cells}/uL (ref 850–3900)
Lymphocytes Relative: 28 %
MCH: 30.5 pg (ref 27.0–33.0)
MCHC: 32.8 g/dL (ref 32.0–36.0)
MCV: 93.2 fL (ref 80.0–100.0)
MONOS PCT: 11 %
MPV: 9.5 fL (ref 7.5–12.5)
Monocytes Absolute: 726 cells/uL (ref 200–950)
NEUTROS ABS: 3828 {cells}/uL (ref 1500–7800)
NEUTROS PCT: 58 %
PLATELETS: 330 10*3/uL (ref 140–400)
RBC: 4.42 MIL/uL (ref 3.80–5.10)
RDW: 13.3 % (ref 11.0–15.0)
WBC: 6.6 10*3/uL (ref 3.8–10.8)

## 2017-02-25 LAB — HEMOGLOBIN A1C
HEMOGLOBIN A1C: 5.7 % — AB (ref <5.7)
MEAN PLASMA GLUCOSE: 117 mg/dL

## 2017-02-25 LAB — BASIC METABOLIC PANEL
BUN: 15 mg/dL (ref 7–25)
CALCIUM: 9.5 mg/dL (ref 8.6–10.4)
CO2: 23 mmol/L (ref 20–31)
Chloride: 104 mmol/L (ref 98–110)
Creat: 0.95 mg/dL (ref 0.50–0.99)
Glucose, Bld: 107 mg/dL — ABNORMAL HIGH (ref 70–99)
POTASSIUM: 4.4 mmol/L (ref 3.5–5.3)
SODIUM: 139 mmol/L (ref 135–146)

## 2017-02-25 LAB — T4, FREE: Free T4: 1.3 ng/dL (ref 0.8–1.8)

## 2017-02-25 LAB — TSH: TSH: 0.88 m[IU]/L

## 2017-02-25 NOTE — Patient Instructions (Signed)
F/U pending results Ultrasound scheduled

## 2017-02-25 NOTE — Progress Notes (Signed)
   Subjective:    Patient ID: Marilyn [name], female    DOB: [birth year], 71 y.o.   MRN: [id number]  Patient presents for Abnormal Growth to Neck (has knot to middle of neck- states that she has known thyroid nodule)  Patient here could he felt a knot on the right side of her throat. She has history of left-sided thyroid not has had biopsy twice it was benign. She has hypothyroidism as well. Currently on 75 g of levothyroxine.. She denies any difficulty swallowing difficulty breathing has had some increased fatigue. On her last set of labs in August she also had elevated fasting glucose due to have this rechecked. Denies any polyuria polydipsia Review of her chart her last ultrasound. Neck was in 2016 at that time left-sided thyroid nodule is stable   Review Of Systems:  GEN-+fatigue, fever, weight loss,weakness, recent illness HEENT- denies eye drainage, change in vision, nasal discharge, CVS- denies chest pain, palpitations RESP- denies SOB, cough, wheeze ABD- denies N/V, change in stools, abd pain GU- denies dysuria, hematuria, dribbling, incontinence MSK- denies joint pain, muscle aches, injury Neuro- denies headache, dizziness, syncope, seizure activity       Objective:    BP 130/78   Pulse 68   Temp 98.1 F (36.7 C) (Oral)   Resp 16   Ht 5' 7.5" (1.715 m)   Wt 161 lb (73 kg)   SpO2 99%   BMI 24.84 kg/m  GEN- NAD, alert and oriented x3 HEENT- PERRL, EOMI, non injected sclera, pink conjunctiva, MMM, oropharynx clear Neck- Supple, no thyromegaly, small prominence right and left side of neck, NT, no LAD  CVS- RRR, no murmur RESP-CTAB EXT- No edema Pulses- Radial  2+        Assessment & Plan:      Problem List Items Addressed This Visit    Hypothyroidism    Recheck thyroid function studies. Also obtain ultrasound of the neck to evaluate the nodules.  With regards to the elevated blood sugar will obtain an A1c today.      Relevant Orders   CBC with  Differential/Platelet   TSH   T3, free   T4, free    Other Visit Diagnoses    Thyroid nodule    -  Primary   Relevant Orders   US Soft Tissue Head/Neck   Elevated fasting glucose       Relevant Orders   Basic metabolic panel   Hemoglobin A1c      Note: This dictation was prepared with Dragon dictation along with smaller phrase technology. Any transcriptional errors that result from this process are unintentional.

## 2017-02-25 NOTE — Assessment & Plan Note (Signed)
Recheck thyroid function studies. Also obtain ultrasound of the neck to evaluate the nodules.  With regards to the elevated blood sugar will obtain an A1c today.

## 2017-03-22 DIAGNOSIS — J111 Influenza due to unidentified influenza virus with other respiratory manifestations: Secondary | ICD-10-CM | POA: Diagnosis not present

## 2017-03-22 DIAGNOSIS — R52 Pain, unspecified: Secondary | ICD-10-CM | POA: Diagnosis not present

## 2017-03-25 DIAGNOSIS — M549 Dorsalgia, unspecified: Secondary | ICD-10-CM | POA: Diagnosis not present

## 2017-03-25 DIAGNOSIS — J111 Influenza due to unidentified influenza virus with other respiratory manifestations: Secondary | ICD-10-CM | POA: Diagnosis not present

## 2017-03-30 DIAGNOSIS — A084 Viral intestinal infection, unspecified: Secondary | ICD-10-CM | POA: Diagnosis not present

## 2017-03-30 DIAGNOSIS — I10 Essential (primary) hypertension: Secondary | ICD-10-CM | POA: Diagnosis not present

## 2017-03-30 DIAGNOSIS — I110 Hypertensive heart disease with heart failure: Secondary | ICD-10-CM | POA: Diagnosis not present

## 2017-06-24 DIAGNOSIS — Z0000 Encounter for general adult medical examination without abnormal findings: Secondary | ICD-10-CM | POA: Diagnosis not present

## 2017-08-05 ENCOUNTER — Ambulatory Visit (INDEPENDENT_AMBULATORY_CARE_PROVIDER_SITE_OTHER): Payer: Medicare HMO | Admitting: Family Medicine

## 2017-08-05 ENCOUNTER — Encounter: Payer: Self-pay | Admitting: Family Medicine

## 2017-08-05 VITALS — BP 130/68 | HR 72 | Temp 98.7°F | Resp 18 | Ht 67.5 in | Wt 158.0 lb

## 2017-08-05 DIAGNOSIS — J069 Acute upper respiratory infection, unspecified: Secondary | ICD-10-CM | POA: Diagnosis not present

## 2017-08-05 DIAGNOSIS — J029 Acute pharyngitis, unspecified: Secondary | ICD-10-CM | POA: Diagnosis not present

## 2017-08-05 LAB — STREP GROUP A AG, W/REFLEX TO CULT: STREGTOCOCCUS GROUP A AG SCREEN: NOT DETECTED

## 2017-08-05 MED ORDER — GUAIFENESIN-CODEINE 100-10 MG/5ML PO SOLN: 5.0000 mL | Freq: Four times a day (QID) | ORAL | 0 refills | Status: DC | PRN

## 2017-08-05 MED ORDER — AMOXICILLIN 875 MG PO TABS: 875.0000 mg | ORAL_TABLET | Freq: Two times a day (BID) | ORAL | 0 refills | Status: DC

## 2017-08-05 NOTE — Patient Instructions (Signed)
Take antibiotics Use cough medicine as needed F/U as needed

## 2017-08-05 NOTE — Progress Notes (Signed)
   Subjective:    Patient ID: Marilyn [name], female    DOB: August 16, 1946, 71 y.o.   MRN: [id number]  Patient presents for Illness (x5 days- productive cough with thick clear mucus, nasal drainage, ear pressure, sore throat, diarrhea noted on Monday) Patient here with sore throat is progressive cough with congestion sinus pressure. Pressure. She had some mild diarrhea on Monday which is now resolved. She's not had any vomiting no fever. She's been taking over-the-counter Coricidin using nasal spray and cough drops. The cough does keep her up at night. No known sick contacts Review Of Systems:  GEN- denies fatigue, fever, weight loss,weakness, recent illness HEENT- denies eye drainage, change in vision,+ nasal discharge, CVS- denies chest pain, palpitations RESP- denies SOB, +cough, wheeze ABD- denies N/V, change in stools, abd pain GU- denies dysuria, hematuria, dribbling, incontinence MSK- denies joint pain, muscle aches, injury Neuro- denies headache, dizziness, syncope, seizure activity       Objective:    BP 130/68   Pulse 72   Temp 98.7 F (37.1 C) (Oral)   Resp 18   Ht 5' 7.5" (1.715 m)   Wt 158 lb (71.7 kg)   SpO2 98%   BMI 24.38 kg/m  GEN- NAD, alert and oriented x3 HEENT- PERRL, EOMI, non injected sclera, pink conjunctiva, MMM, oropharynx injection, tonsilay exudates, TM clear bilat no effusion, hoarse voice  No  maxillary sinus tenderness,+ inflammed turbinates,  Nasal drainage  Neck- Supple, shotty LAD CVS- RRR, no murmur RESP-CTAB EXT- No edema Pulses- Radial 2+         Assessment & Plan:      Problem List Items Addressed This Visit    None    Visit Diagnoses    Pharyngitis, unspecified etiology    -  Primary   Relevant Orders   STREP GROUP A AG, W/REFLEX TO CULT (Completed)   Acute URI       URi with pharyngitis, rapid strep is negative. She is also gettting some sinusitis. Wll cover with amoxicllin, continue nasal saline, cough from drainage   Continue coricidan, given script for robitussin [facility] as well for bedtime       Note: This dictation was prepared with Dragon dictation along with smaller phrase technology. Any transcriptional errors that result from this process are unintentional.

## 2017-08-07 LAB — CULTURE, GROUP A STREP

## 2017-08-18 ENCOUNTER — Telehealth: Payer: Self-pay | Admitting: Family Medicine

## 2017-08-18 DIAGNOSIS — Z01419 Encounter for gynecological examination (general) (routine) without abnormal findings: Secondary | ICD-10-CM | POA: Diagnosis not present

## 2017-08-18 DIAGNOSIS — R351 Nocturia: Secondary | ICD-10-CM | POA: Diagnosis not present

## 2017-08-18 DIAGNOSIS — Z1231 Encounter for screening mammogram for malignant neoplasm of breast: Secondary | ICD-10-CM | POA: Diagnosis not present

## 2017-08-18 DIAGNOSIS — R350 Frequency of micturition: Secondary | ICD-10-CM | POA: Diagnosis not present

## 2017-08-18 DIAGNOSIS — Z6824 Body mass index (BMI) 24.0-24.9, adult: Secondary | ICD-10-CM | POA: Diagnosis not present

## 2017-08-18 DIAGNOSIS — N390 Urinary tract infection, site not specified: Secondary | ICD-10-CM | POA: Diagnosis not present

## 2017-08-18 NOTE — Telephone Encounter (Signed)
Ok to refill 

## 2017-08-18 NOTE — Telephone Encounter (Signed)
Okay to refill, if cough worsens, needs CXR

## 2017-08-18 NOTE — Telephone Encounter (Signed)
Pt is still coughing from last visit wants to know if we can call in some more guaifenesin-codeine to [facility].

## 2017-08-19 MED ORDER — GUAIFENESIN-CODEINE 100-10 MG/5ML PO SOLN: 5.0000 mL | Freq: Four times a day (QID) | ORAL | 0 refills | Status: DC | PRN

## 2017-08-19 NOTE — Telephone Encounter (Signed)
Call placed to patient and patient made aware.   Medication called to pharmacy. 

## 2017-10-07 ENCOUNTER — Ambulatory Visit (INDEPENDENT_AMBULATORY_CARE_PROVIDER_SITE_OTHER): Payer: Medicare HMO

## 2017-10-07 DIAGNOSIS — Z23 Encounter for immunization: Secondary | ICD-10-CM | POA: Diagnosis not present

## 2017-10-07 NOTE — Progress Notes (Signed)
patient was seen in office for flu vaccine. Patient received vaccine in left deltoid. patient tolerated well

## 2017-11-03 ENCOUNTER — Other Ambulatory Visit: Payer: Self-pay

## 2017-11-03 ENCOUNTER — Other Ambulatory Visit: Payer: Medicare HMO

## 2017-11-03 DIAGNOSIS — Z119 Encounter for screening for infectious and parasitic diseases, unspecified: Secondary | ICD-10-CM

## 2017-11-03 DIAGNOSIS — E039 Hypothyroidism, unspecified: Secondary | ICD-10-CM | POA: Diagnosis not present

## 2017-11-03 DIAGNOSIS — Z0000 Encounter for general adult medical examination without abnormal findings: Secondary | ICD-10-CM | POA: Diagnosis not present

## 2017-11-03 DIAGNOSIS — I10 Essential (primary) hypertension: Secondary | ICD-10-CM | POA: Diagnosis not present

## 2017-11-03 DIAGNOSIS — R7301 Impaired fasting glucose: Secondary | ICD-10-CM | POA: Diagnosis not present

## 2017-11-03 DIAGNOSIS — E785 Hyperlipidemia, unspecified: Secondary | ICD-10-CM | POA: Diagnosis not present

## 2017-11-04 LAB — COMPLETE METABOLIC PANEL WITH GFR
AG RATIO: 1.3 (calc) (ref 1.0–2.5)
ALKALINE PHOSPHATASE (APISO): 66 U/L (ref 33–130)
ALT: 14 U/L (ref 6–29)
AST: 24 U/L (ref 10–35)
Albumin: 3.9 g/dL (ref 3.6–5.1)
BILIRUBIN TOTAL: 0.4 mg/dL (ref 0.2–1.2)
BUN/Creatinine Ratio: 17 (calc) (ref 6–22)
BUN: 18 mg/dL (ref 7–25)
CHLORIDE: 96 mmol/L — AB (ref 98–110)
CO2: 19 mmol/L — ABNORMAL LOW (ref 20–32)
Calcium: 9.8 mg/dL (ref 8.6–10.4)
Creat: 1.04 mg/dL — ABNORMAL HIGH (ref 0.60–0.93)
GFR, EST AFRICAN AMERICAN: 63 mL/min/{1.73_m2} (ref >=60)
GFR, Est Non African American: 54 mL/min/{1.73_m2} — ABNORMAL LOW (ref >=60)
Globulin: 3.1 g/dL (calc) (ref 1.9–3.7)
POTASSIUM: 4.5 mmol/L (ref 3.5–5.3)
Sodium: 142 mmol/L (ref 135–146)
TOTAL PROTEIN: 7 g/dL (ref 6.1–8.1)

## 2017-11-04 LAB — TSH: TSH: 0.78 mIU/L (ref 0.40–4.50)

## 2017-11-04 LAB — CBC WITH DIFFERENTIAL/PLATELET
BASOS ABS: 29 {cells}/uL (ref 0–200)
BASOS PCT: 0.5 %
EOS ABS: 151 {cells}/uL (ref 15–500)
Eosinophils Relative: 2.6 %
HEMATOCRIT: 38.3 % (ref 35.0–45.0)
HEMOGLOBIN: 12.8 g/dL (ref 11.7–15.5)
LYMPHS ABS: 1885 {cells}/uL (ref 850–3900)
MCH: 30.6 pg (ref 27.0–33.0)
MCHC: 33.4 g/dL (ref 32.0–36.0)
MCV: 91.6 fL (ref 80.0–100.0)
MPV: 10.3 fL (ref 7.5–12.5)
Monocytes Relative: 10.3 %
NEUTROS ABS: 3138 {cells}/uL (ref 1500–7800)
Neutrophils Relative %: 54.1 %
Platelets: 320 10*3/uL (ref 140–400)
RBC: 4.18 10*6/uL (ref 3.80–5.10)
RDW: 12 % (ref 11.0–15.0)
Total Lymphocyte: 32.5 %
WBC: 5.8 10*3/uL (ref 3.8–10.8)
WBCMIX: 597 {cells}/uL (ref 200–950)

## 2017-11-04 LAB — LIPID PANEL
CHOL/HDL RATIO: 3.2 (calc) (ref <5.0)
CHOLESTEROL: 200 mg/dL — AB (ref <200)
HDL: 62 mg/dL (ref >50)
LDL CHOLESTEROL (CALC): 123 mg/dL — AB
Non-HDL Cholesterol (Calc): 138 mg/dL (calc) — ABNORMAL HIGH (ref <130)
TRIGLYCERIDES: 60 mg/dL (ref <150)

## 2017-11-04 LAB — SPECIMEN COMPROMISED

## 2017-11-04 LAB — HEPATITIS C ANTIBODY
Hepatitis C Ab: NONREACTIVE
SIGNAL TO CUT-OFF: 0 (ref <1.00)

## 2017-11-06 ENCOUNTER — Other Ambulatory Visit: Payer: Self-pay

## 2017-11-06 ENCOUNTER — Encounter: Payer: Self-pay | Admitting: Family Medicine

## 2017-11-06 ENCOUNTER — Ambulatory Visit (INDEPENDENT_AMBULATORY_CARE_PROVIDER_SITE_OTHER): Payer: Medicare HMO | Admitting: Family Medicine

## 2017-11-06 VITALS — BP 128/74 | HR 76 | Temp 97.7°F | Resp 12 | Ht 67.5 in | Wt 156.0 lb

## 2017-11-06 DIAGNOSIS — E039 Hypothyroidism, unspecified: Secondary | ICD-10-CM

## 2017-11-06 DIAGNOSIS — R7301 Impaired fasting glucose: Secondary | ICD-10-CM

## 2017-11-06 DIAGNOSIS — I10 Essential (primary) hypertension: Secondary | ICD-10-CM

## 2017-11-06 DIAGNOSIS — Z0000 Encounter for general adult medical examination without abnormal findings: Secondary | ICD-10-CM

## 2017-11-06 LAB — GLUCOSE 16585: GLUCOSE 2500: 101 mg/dL — AB (ref 65–99)

## 2017-11-06 NOTE — Progress Notes (Signed)
Subjective:    Patient ID: Marilyn [name], female    DOB: [birth year], 71 y.o.   MRN: [id number]  HPI Patient is here today for complete physical exam. She sees her gynecologist for a Pap smear.  She had a mammogram this year which was normal. Her colonoscopy was in 2013 and was normal.  It is due again in 2023. She's had a tetanus shot.  Pneumovax 23 and Prevnar teen up-to-date.  She has a history of elevated FBS but fasting glucose today was 101.  Her most recent lab work as listed below:  Past Medical History:  Diagnosis Date  . Bulging disc   . Hypertension   . Osteoporosis   . Rosacea   . Thyroid disease    Past Surgical History:  Procedure Laterality Date  . BILATERAL TEMPOROMANDIBULAR JOINT ARTHROPLASTY    . TUBAL LIGATION     Current Outpatient Medications on File Prior to Visit  Medication Sig Dispense Refill  . Ascorbic Acid (VITAMIN C) 100 MG tablet Take 100 mg daily by mouth.    Marland Kitchen aspirin 81 MG tablet Take 81 mg by mouth daily.    . B Complex-Biotin-FA (B-COMPLEX PO) Take by mouth daily.    . benazepril (LOTENSIN) 20 MG tablet Take 1 tablet (20 mg total) by mouth daily. 90 tablet 3  . BIOTIN PO Take 1 tablet by mouth daily.    . calcium carbonate (OS-CAL) 600 MG TABS Take 600 mg by mouth daily.    . Cholecalciferol (VITAMIN D) 2000 UNITS tablet Take 2,000 Units by mouth daily.    . Fish Oil-Cholecalciferol (FISH OIL + D3 PO) Take by mouth. Fish Oil 1200 mg/ D3 2000 IU  Take one daily    . Glucosamine-Chondroitin (COSAMIN DS PO) Take by mouth daily. Take 1 pill on odd days and 2 pills on even days    . levocetirizine (XYZAL) 5 MG tablet Take 5 mg by mouth every evening.    Marland Kitchen levothyroxine (SYNTHROID, LEVOTHROID) 75 MCG tablet Take 1 tablet (75 mcg total) by mouth daily. 90 tablet 3  . vitamin E (VITAMIN E) 1000 UNIT capsule Take 1,000 Units by mouth daily.     No current facility-administered medications on file prior to visit.    No Known Allergies Social History    Socioeconomic History  . Marital status: Married    Spouse name: Not on file  . Number of children: Not on file  . Years of education: Not on file  . Highest education level: Not on file  Social Needs  . Financial resource strain: Not on file  . Food insecurity - worry: Not on file  . Food insecurity - inability: Not on file  . Transportation needs - medical: Not on file  . Transportation needs - non-medical: Not on file  Occupational History  . Not on file  Tobacco Use  . Smoking status: Former Smoker    Types: Cigarettes    Last attempt to quit: 02/24/1992    Years since quitting: 25.7  . Smokeless tobacco: Never Used  Substance and Sexual Activity  . Alcohol use: No  . Drug use: No  . Sexual activity: Not on file  Other Topics Concern  . Not on file  Social History Narrative  . Not on file   Family History  Problem Relation Age of Onset  . Colon cancer Neg Hx       Review of Systems  All other systems reviewed and are negative.  Objective:   Physical Exam  Constitutional: She is oriented to person, place, and time. She appears well-developed and well-nourished. No distress.  HENT:  Head: Normocephalic and atraumatic.  Right Ear: External ear normal.  Left Ear: External ear normal.  Nose: Nose normal.  Mouth/Throat: Oropharynx is clear and moist. No oropharyngeal exudate.  Eyes: Conjunctivae and EOM are normal. Pupils are equal, round, and reactive to light. Right eye exhibits no discharge. Left eye exhibits no discharge. No scleral icterus.  Neck: Normal range of motion. Neck supple. No JVD present. No tracheal deviation present. No thyromegaly present.  Cardiovascular: Normal rate, regular rhythm, normal heart sounds and intact distal pulses. Exam reveals no gallop and no friction rub.  No murmur heard. Pulmonary/Chest: Effort normal and breath sounds normal. No stridor. No respiratory distress. She has no wheezes. She has no rales. She exhibits no  tenderness.  Abdominal: Soft. Bowel sounds are normal. She exhibits no distension and no mass. There is no tenderness. There is no rebound and no guarding.  Musculoskeletal: Normal range of motion. She exhibits no edema or tenderness.  Lymphadenopathy:    She has no cervical adenopathy.  Neurological: She is alert and oriented to person, place, and time. She has normal reflexes. No cranial nerve deficit. She exhibits normal muscle tone. Coordination normal.  Skin: Skin is warm. No rash noted. She is not diaphoretic. No erythema. No pallor.  Psychiatric: She has a normal mood and affect. Her behavior is normal. Judgment and thought content normal.  Vitals reviewed.         Assessment & Plan:  Routine general medical examination at a health care facility  Elevated fasting glucose  Hypothyroidism, unspecified type  Essential hypertension Patient's physical exam is completely normal today. Colonoscopy is up-to-date. Mammogram is up-to-date. Pap smear is up-to-date. Immunizations are up-to-date.  Her lab work is excellent except for a mildly elevated fasting blood sugar but this is much improved compared to last year.  Thyroid is appropriately treated based on a normal TSH.  I did recommend a shingles vaccine.  Will check on the price first.  Bone density test is performed her gynecologist.  She denies any problems with falls or or memory loss.

## 2017-11-06 NOTE — Addendum Note (Signed)
Addended by: Shary Decamp B on: 11/06/2017 12:34 PM   Modules accepted: Orders

## 2018-02-01 DIAGNOSIS — H524 Presbyopia: Secondary | ICD-10-CM | POA: Diagnosis not present

## 2018-02-01 DIAGNOSIS — H5203 Hypermetropia, bilateral: Secondary | ICD-10-CM | POA: Diagnosis not present

## 2018-02-01 DIAGNOSIS — H52223 Regular astigmatism, bilateral: Secondary | ICD-10-CM | POA: Diagnosis not present

## 2018-03-17 ENCOUNTER — Other Ambulatory Visit: Payer: Self-pay | Admitting: Family Medicine

## 2018-05-04 DIAGNOSIS — Z872 Personal history of diseases of the skin and subcutaneous tissue: Secondary | ICD-10-CM | POA: Diagnosis not present

## 2018-05-04 DIAGNOSIS — D1801 Hemangioma of skin and subcutaneous tissue: Secondary | ICD-10-CM | POA: Diagnosis not present

## 2018-05-04 DIAGNOSIS — D225 Melanocytic nevi of trunk: Secondary | ICD-10-CM | POA: Diagnosis not present

## 2018-05-04 DIAGNOSIS — L814 Other melanin hyperpigmentation: Secondary | ICD-10-CM | POA: Diagnosis not present

## 2018-05-04 DIAGNOSIS — L821 Other seborrheic keratosis: Secondary | ICD-10-CM | POA: Diagnosis not present

## 2018-09-01 DIAGNOSIS — Z130 Encounter for screening for diseases of the blood and blood-forming organs and certain disorders involving the immune mechanism: Secondary | ICD-10-CM | POA: Diagnosis not present

## 2018-09-01 DIAGNOSIS — Z13220 Encounter for screening for lipoid disorders: Secondary | ICD-10-CM | POA: Diagnosis not present

## 2018-09-01 DIAGNOSIS — M859 Disorder of bone density and structure, unspecified: Secondary | ICD-10-CM | POA: Diagnosis not present

## 2018-09-01 DIAGNOSIS — R7302 Impaired glucose tolerance (oral): Secondary | ICD-10-CM | POA: Diagnosis not present

## 2018-09-01 DIAGNOSIS — E785 Hyperlipidemia, unspecified: Secondary | ICD-10-CM | POA: Diagnosis not present

## 2018-09-01 DIAGNOSIS — Z1231 Encounter for screening mammogram for malignant neoplasm of breast: Secondary | ICD-10-CM | POA: Diagnosis not present

## 2018-09-01 DIAGNOSIS — Z124 Encounter for screening for malignant neoplasm of cervix: Secondary | ICD-10-CM | POA: Diagnosis not present

## 2018-09-01 DIAGNOSIS — Z6825 Body mass index (BMI) 25.0-25.9, adult: Secondary | ICD-10-CM | POA: Diagnosis not present

## 2018-09-01 DIAGNOSIS — E559 Vitamin D deficiency, unspecified: Secondary | ICD-10-CM | POA: Diagnosis not present

## 2018-09-01 DIAGNOSIS — Z1329 Encounter for screening for other suspected endocrine disorder: Secondary | ICD-10-CM | POA: Diagnosis not present

## 2018-09-01 DIAGNOSIS — R5383 Other fatigue: Secondary | ICD-10-CM | POA: Diagnosis not present

## 2018-09-01 DIAGNOSIS — Z13228 Encounter for screening for other metabolic disorders: Secondary | ICD-10-CM | POA: Diagnosis not present

## 2018-09-02 ENCOUNTER — Other Ambulatory Visit: Payer: Self-pay | Admitting: Obstetrics & Gynecology

## 2018-09-02 DIAGNOSIS — R928 Other abnormal and inconclusive findings on diagnostic imaging of breast: Secondary | ICD-10-CM

## 2018-09-02 DIAGNOSIS — R69 Illness, unspecified: Secondary | ICD-10-CM | POA: Diagnosis not present

## 2018-09-06 ENCOUNTER — Other Ambulatory Visit: Payer: Self-pay | Admitting: Obstetrics & Gynecology

## 2018-09-06 ENCOUNTER — Ambulatory Visit
Admission: RE | Admit: 2018-09-06 | Discharge: 2018-09-06 | Disposition: A | Discharge status: Home / Self Care | Payer: Medicare HMO | Source: Ambulatory Visit | Attending: Obstetrics & Gynecology | Admitting: Obstetrics & Gynecology

## 2018-09-06 DIAGNOSIS — R922 Inconclusive mammogram: Secondary | ICD-10-CM | POA: Diagnosis not present

## 2018-09-06 DIAGNOSIS — R928 Other abnormal and inconclusive findings on diagnostic imaging of breast: Secondary | ICD-10-CM

## 2018-09-06 DIAGNOSIS — N6489 Other specified disorders of breast: Secondary | ICD-10-CM | POA: Diagnosis not present

## 2018-09-07 ENCOUNTER — Ambulatory Visit (INDEPENDENT_AMBULATORY_CARE_PROVIDER_SITE_OTHER): Payer: Medicare HMO | Admitting: Family Medicine

## 2018-09-07 DIAGNOSIS — Z23 Encounter for immunization: Secondary | ICD-10-CM | POA: Diagnosis not present

## 2018-09-13 ENCOUNTER — Other Ambulatory Visit: Payer: Self-pay | Admitting: Obstetrics & Gynecology

## 2018-09-13 DIAGNOSIS — N6489 Other specified disorders of breast: Secondary | ICD-10-CM

## 2018-09-15 ENCOUNTER — Ambulatory Visit
Admission: RE | Admit: 2018-09-15 | Discharge: 2018-09-15 | Disposition: A | Discharge status: Home / Self Care | Payer: Medicare HMO | Source: Ambulatory Visit | Attending: Obstetrics & Gynecology | Admitting: Obstetrics & Gynecology

## 2018-09-15 ENCOUNTER — Other Ambulatory Visit: Payer: Self-pay | Admitting: Obstetrics & Gynecology

## 2018-09-15 DIAGNOSIS — N6011 Diffuse cystic mastopathy of right breast: Secondary | ICD-10-CM | POA: Diagnosis not present

## 2018-09-15 DIAGNOSIS — N6489 Other specified disorders of breast: Secondary | ICD-10-CM

## 2018-09-15 DIAGNOSIS — N6341 Unspecified lump in right breast, subareolar: Secondary | ICD-10-CM | POA: Diagnosis not present

## 2018-12-27 ENCOUNTER — Other Ambulatory Visit: Payer: Self-pay | Admitting: Family Medicine

## 2019-02-14 DIAGNOSIS — Z0100 Encounter for examination of eyes and vision without abnormal findings: Secondary | ICD-10-CM | POA: Diagnosis not present

## 2019-03-14 ENCOUNTER — Other Ambulatory Visit: Payer: Self-pay | Admitting: Family Medicine

## 2019-06-01 IMAGING — MG DIGITAL DIAGNOSTIC UNILATERAL RIGHT MAMMOGRAM WITH TOMO AND CAD
8 series · 8 of 24 positions shown · non-contrast
Comparison: Previous exam(s).

CLINICAL DATA: Patient recalled from screening for possible right
breast distortion.

EXAM:
DIGITAL DIAGNOSTIC RIGHT MAMMOGRAM WITH CAD AND TOMO
ULTRASOUND RIGHT BREAST

[R MLO synth-2D]
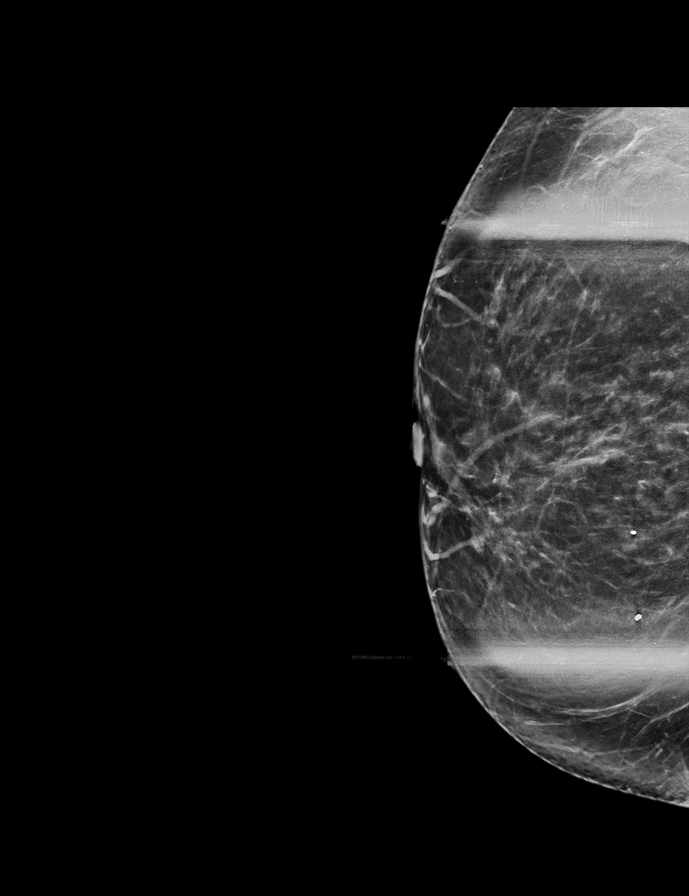

[R CC synth-2D (1 of 2)]
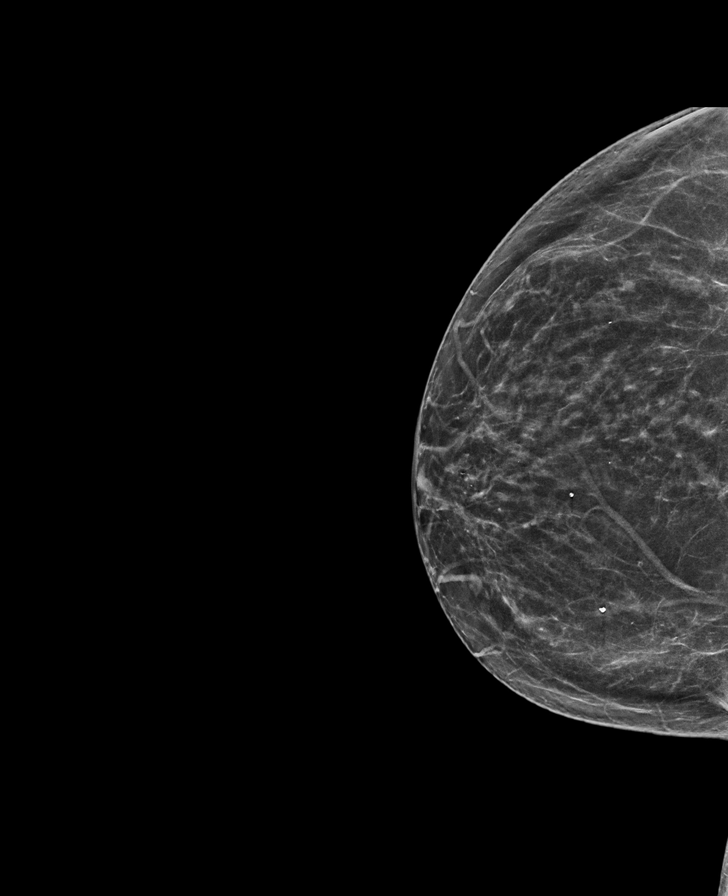

[R ML synth-2D]
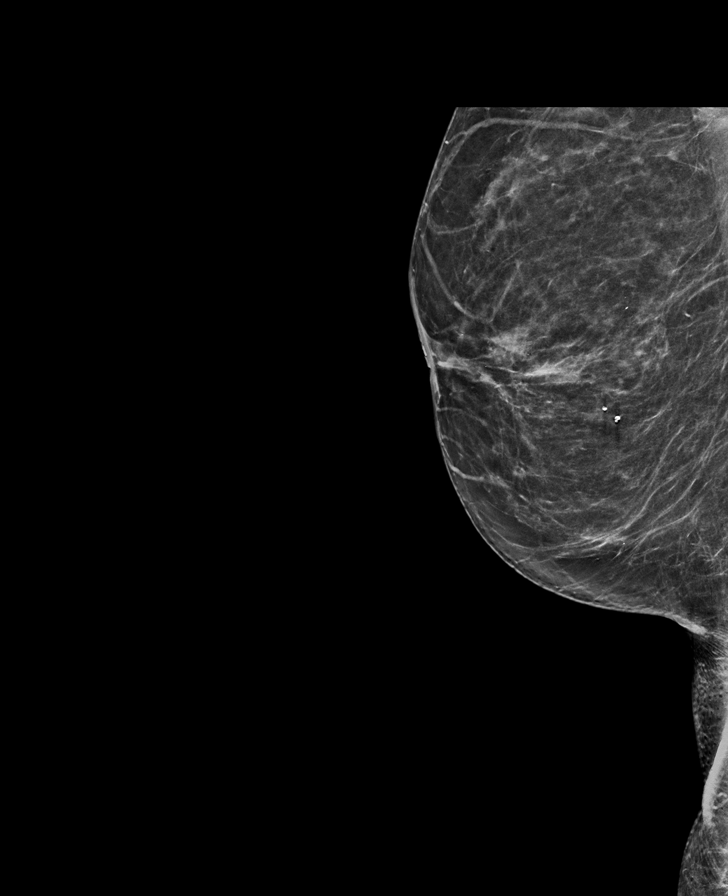

[R CC synth-2D (2 of 2)]
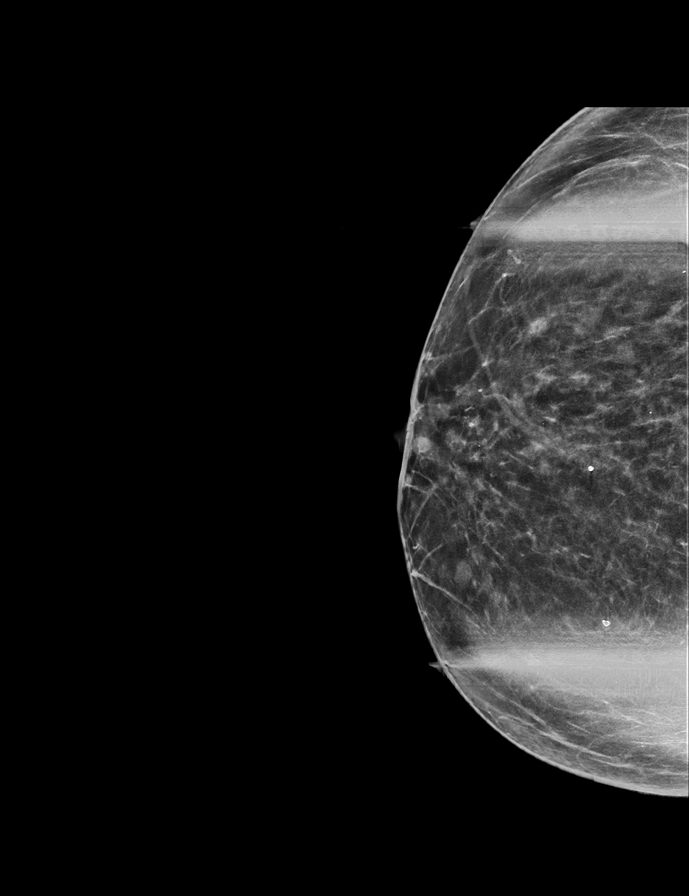

[R CC tomo (1 of 2) · tomo slice 33/66.0]
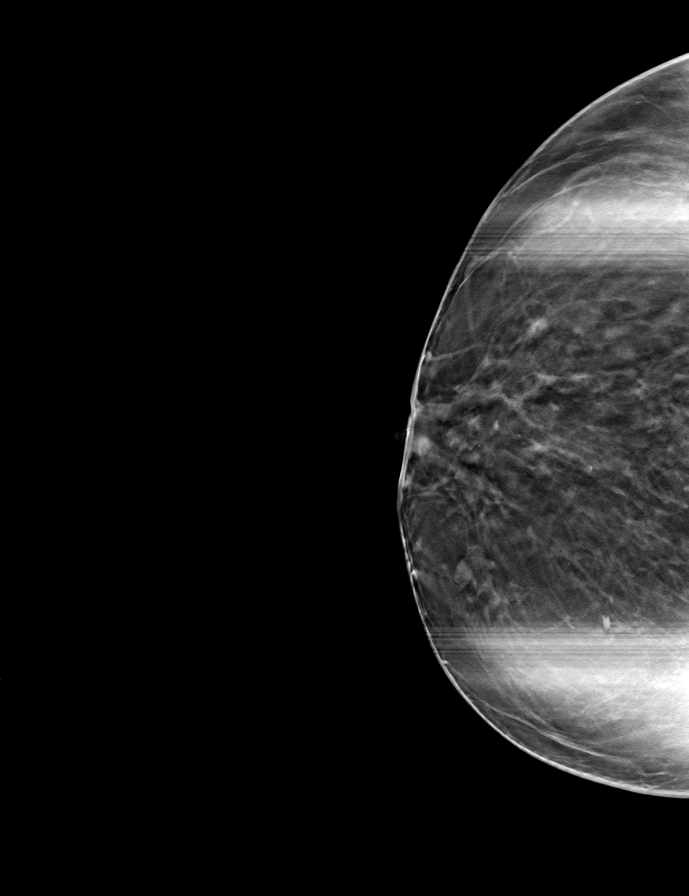

[R MLO tomo · tomo slice 33/65.0]
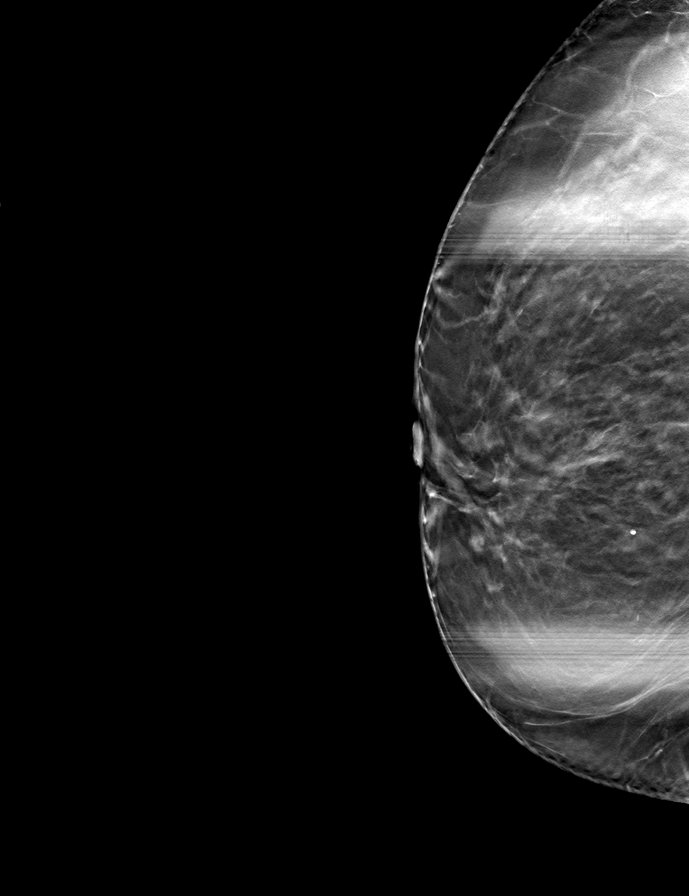

[R ML tomo · tomo slice 35/69.0]
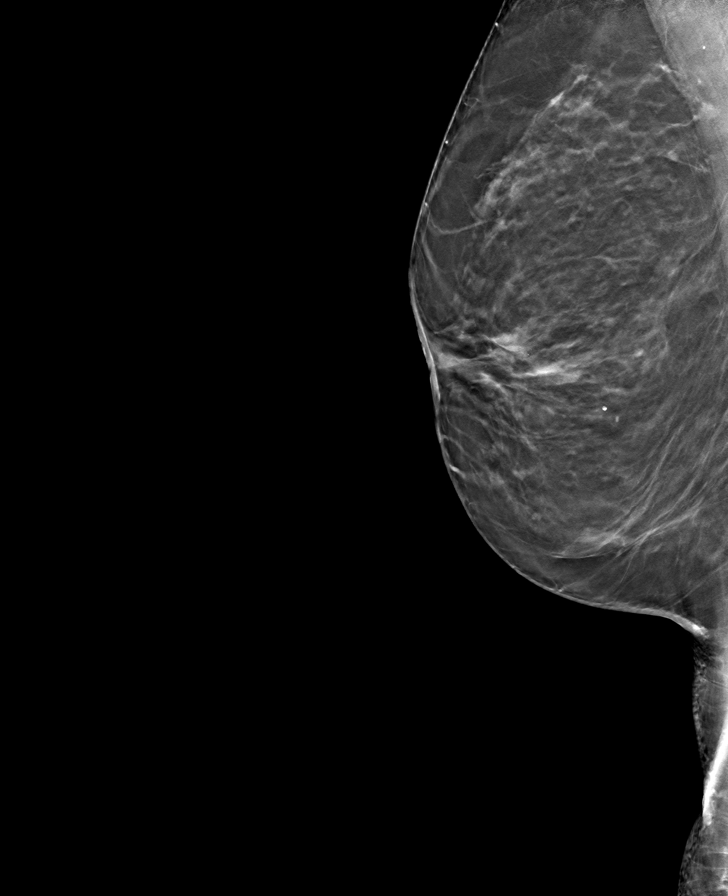

[R CC tomo (2 of 2) · tomo slice 34/67.0]
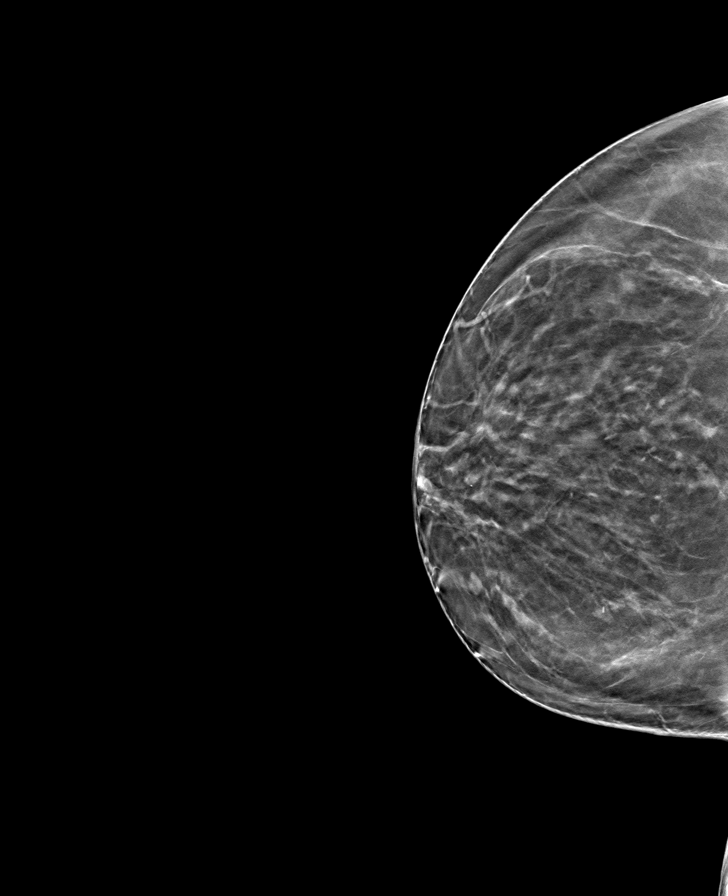

[8 of 24 positions shown; findings below may reference images not displayed]

ACR Breast Density Category c: The breast tissue is heterogeneously
dense, which may obscure small masses.
FINDINGS: Additional imaging including spot compression CC and MLO
tomosynthesis images and full paddle true lateral tomosynthesis
images of the right breast were obtained. There is persistent subtle
distortion within the retroareolar right breast.

Mammographic images were processed with CAD.

On physical exam, I palpate no discrete mass within the retroareolar
right breast.

Targeted ultrasound is performed, showing no discrete abnormality
within the retroareolar right breast. No right axillary adenopathy.
IMPRESSION: Persistent subtle distortion within the retroareolar right breast on
mammography.

RECOMMENDATION:
Stereotactic guided core needle biopsy subtle distortion
retroareolar right breast.

I have discussed the findings and recommendations with the patient.
Results were also provided in writing at the conclusion of the
visit. If applicable, a reminder letter will be sent to the patient
regarding the next appointment.

BI-RADS CATEGORY  4: Suspicious.

## 2019-08-02 DIAGNOSIS — R69 Illness, unspecified: Secondary | ICD-10-CM | POA: Diagnosis not present

## 2019-10-11 DIAGNOSIS — M816 Localized osteoporosis [Lequesne]: Secondary | ICD-10-CM | POA: Diagnosis not present

## 2019-10-11 DIAGNOSIS — N958 Other specified menopausal and perimenopausal disorders: Secondary | ICD-10-CM | POA: Diagnosis not present

## 2019-10-11 DIAGNOSIS — Z01419 Encounter for gynecological examination (general) (routine) without abnormal findings: Secondary | ICD-10-CM | POA: Diagnosis not present

## 2019-10-11 DIAGNOSIS — E785 Hyperlipidemia, unspecified: Secondary | ICD-10-CM | POA: Diagnosis not present

## 2019-10-11 DIAGNOSIS — Z6824 Body mass index (BMI) 24.0-24.9, adult: Secondary | ICD-10-CM | POA: Diagnosis not present

## 2019-10-11 DIAGNOSIS — Z1231 Encounter for screening mammogram for malignant neoplasm of breast: Secondary | ICD-10-CM | POA: Diagnosis not present

## 2019-10-11 LAB — HM DEXA SCAN

## 2019-10-15 DIAGNOSIS — R69 Illness, unspecified: Secondary | ICD-10-CM | POA: Diagnosis not present

## 2019-10-27 DIAGNOSIS — R69 Illness, unspecified: Secondary | ICD-10-CM | POA: Diagnosis not present

## 2019-12-29 DIAGNOSIS — E039 Hypothyroidism, unspecified: Secondary | ICD-10-CM | POA: Diagnosis not present

## 2019-12-29 DIAGNOSIS — Z809 Family history of malignant neoplasm, unspecified: Secondary | ICD-10-CM | POA: Diagnosis not present

## 2019-12-29 DIAGNOSIS — Z833 Family history of diabetes mellitus: Secondary | ICD-10-CM | POA: Diagnosis not present

## 2019-12-29 DIAGNOSIS — Z7982 Long term (current) use of aspirin: Secondary | ICD-10-CM | POA: Diagnosis not present

## 2019-12-29 DIAGNOSIS — Z87891 Personal history of nicotine dependence: Secondary | ICD-10-CM | POA: Diagnosis not present

## 2019-12-29 DIAGNOSIS — M1990 Unspecified osteoarthritis, unspecified site: Secondary | ICD-10-CM | POA: Diagnosis not present

## 2019-12-29 DIAGNOSIS — Z8249 Family history of ischemic heart disease and other diseases of the circulatory system: Secondary | ICD-10-CM | POA: Diagnosis not present

## 2019-12-29 DIAGNOSIS — J309 Allergic rhinitis, unspecified: Secondary | ICD-10-CM | POA: Diagnosis not present

## 2019-12-29 DIAGNOSIS — I10 Essential (primary) hypertension: Secondary | ICD-10-CM | POA: Diagnosis not present

## 2020-01-18 DIAGNOSIS — R69 Illness, unspecified: Secondary | ICD-10-CM | POA: Diagnosis not present

## 2020-02-06 DIAGNOSIS — R69 Illness, unspecified: Secondary | ICD-10-CM | POA: Diagnosis not present

## 2020-03-16 DIAGNOSIS — H5203 Hypermetropia, bilateral: Secondary | ICD-10-CM | POA: Diagnosis not present

## 2020-03-19 ENCOUNTER — Other Ambulatory Visit: Payer: Self-pay | Admitting: Family Medicine

## 2020-07-04 ENCOUNTER — Encounter: Payer: Self-pay | Admitting: Family Medicine

## 2020-07-04 ENCOUNTER — Ambulatory Visit (INDEPENDENT_AMBULATORY_CARE_PROVIDER_SITE_OTHER): Payer: Medicare HMO | Admitting: Family Medicine

## 2020-07-04 ENCOUNTER — Other Ambulatory Visit: Payer: Self-pay

## 2020-07-04 VITALS — BP 128/70 | HR 80 | Temp 97.9°F | Resp 14 | Ht 67.0 in | Wt 158.0 lb

## 2020-07-04 DIAGNOSIS — I10 Essential (primary) hypertension: Secondary | ICD-10-CM | POA: Diagnosis not present

## 2020-07-04 DIAGNOSIS — J329 Chronic sinusitis, unspecified: Secondary | ICD-10-CM

## 2020-07-04 DIAGNOSIS — E039 Hypothyroidism, unspecified: Secondary | ICD-10-CM | POA: Diagnosis not present

## 2020-07-04 MED ORDER — AMOXICILLIN-POT CLAVULANATE 875-125 MG PO TABS: 1.0000 | ORAL_TABLET | Freq: Two times a day (BID) | ORAL | 0 refills | Status: DC

## 2020-07-04 MED ORDER — PREDNISONE 20 MG PO TABS: 20.0000 mg | ORAL_TABLET | Freq: Every day | ORAL | 0 refills | Status: DC

## 2020-07-04 NOTE — Assessment & Plan Note (Signed)
Controlled, check renal function CBC Continue lotensin

## 2020-07-04 NOTE — Patient Instructions (Addendum)
Schedule a physical with Dr. Dennard Schaumann Use nasacort or flonase Continue claritin Prednisone for 1 week Antibiotics for 10 days CT of sinuses to be scheduled

## 2020-07-04 NOTE — Progress Notes (Signed)
   Subjective:    Patient ID: Marilyn [name], female    DOB: 03/31/1946, 74 y.o.   MRN: [id number]  Patient presents for Sinus Congestion (x8 months- nasal congestion- using OTC meds)  In Oct 2020 had dental pain and pressure in sinuses, seen by dentist, had imaging done, told that she had a significant sinus infection.  Given antibiotics.  She was also seen by oral surgeon.  She had recurrent sinus pressure and daily.  She gets a tickle in the so she was given another round of antibiotics as well as a dose of prednisone.  She continues to have pressure in her face l.   She gets nasal drainage daily with the pressure, has tickle in throat, had frontal headache   Sinuses feel scratchy  she has been using a natural nasal rinse Angus Seller   takes claritin as well  She hasnot used any decongestants   No history of sinus surgery   Note patient last visit in the office was in the fall 2018.  She is now having for for her thyroid or her renal function that she is also on Lotensin.  Hypothyroidism- taking levothyrooxine      Review Of Systems:  GEN- denies fatigue, fever, weight loss,weakness, recent illness HEENT- denies eye drainage, change in vision, +nasal discharge, CVS- denies chest pain, palpitations RESP- denies SOB, cough, wheeze ABD- denies N/V, change in stools, abd pain GU- denies dysuria, hematuria, dribbling, incontinence MSK- denies joint pain, muscle aches, injury Neuro- + sinus headache, dizziness, syncope, seizure activity       Objective:    BP 128/70   Pulse 80   Temp 97.9 F (36.6 C) (Temporal)   Resp 14   Ht 5\' 7"  (1.702 m)   Wt 158 lb (71.7 kg)   SpO2 96%   BMI 24.75 kg/m  GEN- NAD, alert and oriented x3 HEENT- PERRL, EOMI, non injected sclera, pink conjunctiva, MMM, oropharynx clear  TM clear bilat no effusion,  + ethmoid sinus tenderness, inflammed turbinates,  Nasal drainage  Neck- Supple, shotty submandibular LAD CVS- RRR, no murmur RESP-CTAB EXT- No  edema Pulses- Radial 2+         Assessment & Plan:      Problem List Items Addressed This Visit      Unprioritized   Hypertension    Controlled, check renal function CBC Continue lotensin      Relevant Orders   CBC with Differential/Platelet   Comprehensive metabolic panel   Hypothyroidism   Relevant Orders   TSH   T3, free    Other Visit Diagnoses    Recurrent sinus infections    -  Primary   Recurrent infections, treated multiple times without clearance of symptoms, left side is worse, ? polyp or blockage cuasing chronic symptoms, obtain CT max/face Noted on dental imaging x 2  Start prednisone 20mg  daily x 1 week augmentin  change to nasal steroid   ENT pending results of scan    Relevant Medications   loratadine (CLARITIN) 10 MG tablet   Sodium Chloride-Xylitol (XLEAR SINUS CARE SPRAY NA)   amoxicillin-clavulanate (AUGMENTIN) 875-125 MG tablet   predniSONE (DELTASONE) 20 MG tablet   Other Relevant Orders   CT Maxillofacial WO CM      Note: This dictation was prepared with Dragon dictation along with smaller phrase technology. Any transcriptional errors that result from this process are unintentional.

## 2020-07-05 LAB — CBC WITH DIFFERENTIAL/PLATELET
Absolute Monocytes: 710 cells/uL (ref 200–950)
Basophils Absolute: 23 cells/uL (ref 0–200)
Basophils Relative: 0.3 %
Eosinophils Absolute: 187 cells/uL (ref 15–500)
Eosinophils Relative: 2.4 %
HCT: 39.4 % (ref 35.0–45.0)
Hemoglobin: 13.3 g/dL (ref 11.7–15.5)
Lymphs Abs: 1966 cells/uL (ref 850–3900)
MCH: 31.8 pg (ref 27.0–33.0)
MCHC: 33.8 g/dL (ref 32.0–36.0)
MCV: 94.3 fL (ref 80.0–100.0)
MPV: 10.1 fL (ref 7.5–12.5)
Monocytes Relative: 9.1 %
Neutro Abs: 4914 cells/uL (ref 1500–7800)
Neutrophils Relative %: 63 %
Platelets: 300 10*3/uL (ref 140–400)
RBC: 4.18 10*6/uL (ref 3.80–5.10)
RDW: 12.4 % (ref 11.0–15.0)
Total Lymphocyte: 25.2 %
WBC: 7.8 10*3/uL (ref 3.8–10.8)

## 2020-07-05 LAB — TSH: TSH: 1.63 mIU/L (ref 0.40–4.50)

## 2020-07-05 LAB — COMPREHENSIVE METABOLIC PANEL
AG Ratio: 1.6 (calc) (ref 1.0–2.5)
ALT: 12 U/L (ref 6–29)
AST: 19 U/L (ref 10–35)
Albumin: 4.2 g/dL (ref 3.6–5.1)
Alkaline phosphatase (APISO): 58 U/L (ref 37–153)
BUN: 17 mg/dL (ref 7–25)
CO2: 26 mmol/L (ref 20–32)
Calcium: 9.6 mg/dL (ref 8.6–10.4)
Chloride: 102 mmol/L (ref 98–110)
Creat: 0.88 mg/dL (ref 0.60–0.93)
Globulin: 2.7 g/dL (calc) (ref 1.9–3.7)
Glucose, Bld: 102 mg/dL — ABNORMAL HIGH (ref 65–99)
Potassium: 4.7 mmol/L (ref 3.5–5.3)
Sodium: 138 mmol/L (ref 135–146)
Total Bilirubin: 0.5 mg/dL (ref 0.2–1.2)
Total Protein: 6.9 g/dL (ref 6.1–8.1)

## 2020-07-05 LAB — T3, FREE: T3, Free: 3 pg/mL (ref 2.3–4.2)

## 2020-07-06 ENCOUNTER — Encounter: Payer: Self-pay | Admitting: *Deleted

## 2020-07-18 ENCOUNTER — Other Ambulatory Visit: Payer: Medicare HMO

## 2020-07-19 ENCOUNTER — Telehealth: Payer: Self-pay | Admitting: *Deleted

## 2020-07-19 DIAGNOSIS — J329 Chronic sinusitis, unspecified: Secondary | ICD-10-CM

## 2020-07-19 NOTE — Telephone Encounter (Signed)
Roney Marion  Vincy Feliz, Eden Lathe, LPN Please place a referral to ENT per Dr. Dennard Schaumann.    Referral orders placed.

## 2020-08-07 DIAGNOSIS — R69 Illness, unspecified: Secondary | ICD-10-CM | POA: Diagnosis not present

## 2020-08-27 DIAGNOSIS — J320 Chronic maxillary sinusitis: Secondary | ICD-10-CM | POA: Insufficient documentation

## 2020-08-27 DIAGNOSIS — J329 Chronic sinusitis, unspecified: Secondary | ICD-10-CM | POA: Diagnosis not present

## 2020-09-11 DIAGNOSIS — J320 Chronic maxillary sinusitis: Secondary | ICD-10-CM | POA: Diagnosis not present

## 2020-09-11 DIAGNOSIS — J3489 Other specified disorders of nose and nasal sinuses: Secondary | ICD-10-CM | POA: Diagnosis not present

## 2020-09-11 DIAGNOSIS — J342 Deviated nasal septum: Secondary | ICD-10-CM | POA: Diagnosis not present

## 2020-10-17 DIAGNOSIS — M79671 Pain in right foot: Secondary | ICD-10-CM | POA: Diagnosis not present

## 2020-10-22 ENCOUNTER — Telehealth: Payer: Self-pay

## 2020-10-23 DIAGNOSIS — R519 Headache, unspecified: Secondary | ICD-10-CM | POA: Diagnosis not present

## 2020-10-23 DIAGNOSIS — R197 Diarrhea, unspecified: Secondary | ICD-10-CM | POA: Diagnosis not present

## 2020-10-23 DIAGNOSIS — R5383 Other fatigue: Secondary | ICD-10-CM | POA: Diagnosis not present

## 2020-10-23 DIAGNOSIS — R110 Nausea: Secondary | ICD-10-CM | POA: Diagnosis not present

## 2020-10-26 NOTE — Telephone Encounter (Signed)
Refill for Pt

## 2020-11-01 ENCOUNTER — Other Ambulatory Visit: Payer: Self-pay

## 2020-11-01 ENCOUNTER — Ambulatory Visit (INDEPENDENT_AMBULATORY_CARE_PROVIDER_SITE_OTHER): Payer: Medicare HMO | Admitting: Family Medicine

## 2020-11-01 VITALS — BP 130/70 | HR 79 | Temp 97.5°F | Ht 67.0 in | Wt 153.0 lb

## 2020-11-01 DIAGNOSIS — R197 Diarrhea, unspecified: Secondary | ICD-10-CM | POA: Diagnosis not present

## 2020-11-01 MED ORDER — METRONIDAZOLE 500 MG PO TABS: 500.0000 mg | ORAL_TABLET | Freq: Three times a day (TID) | ORAL | 0 refills | Status: DC

## 2020-11-01 MED ORDER — SACCHAROMYCES BOULARDII 250 MG PO CAPS: 250.0000 mg | ORAL_CAPSULE | Freq: Two times a day (BID) | ORAL | 0 refills | Status: DC

## 2020-11-01 NOTE — Progress Notes (Signed)
Subjective:    Patient ID: Marilyn [name], female    DOB: 09/02/1946, 74 y.o.   MRN: [id number]  HPI Patient was apparently treated with Augmentin during the summer and has most recently finished clindamycin for a sinus infection.  She believes that she finished the clindamycin at the middle of October.  Since that time, she has had 10 days of diarrhea.  She states that initially she had a low-grade fever, body ache, and nausea along with intestinal pain and cramps.  The fever has subsided but she continues to have the diarrhea.  She states that some days she has very little diarrhea.  Other days she will have 3 or 4 loose watery bowel movements.  She also has occasional crampy pain.  She denies any bloody diarrhea.  She lives with her husband.  He is not experiencing any diarrhea.  She denies any nausea or vomiting.  She denies any fevers or chills.  She denies any melena. Past Medical History:  Diagnosis Date  . Bulging disc   . Hypertension   . Osteoporosis   . Rosacea   . Thyroid disease    Past Surgical History:  Procedure Laterality Date  . BILATERAL TEMPOROMANDIBULAR JOINT ARTHROPLASTY    . TUBAL LIGATION     Current Outpatient Medications on File Prior to Visit  Medication Sig Dispense Refill  . Ascorbic Acid (VITAMIN C) 100 MG tablet Take 100 mg daily by mouth.    Marland Kitchen aspirin 81 MG tablet Take 81 mg by mouth daily.    . benazepril (LOTENSIN) 20 MG tablet TAKE 1 TABLET DAILY 90 tablet 3  . BIOTIN PO Take 1 tablet by mouth daily.    . calcium carbonate (OS-CAL) 600 MG TABS Take 600 mg by mouth daily.    . Cholecalciferol (VITAMIN D) 2000 UNITS tablet Take 2,000 Units by mouth daily.    . Glucosamine-Chondroitin (COSAMIN DS PO) Take by mouth daily. Take 1 pill on odd days and 2 pills on even days    . levothyroxine (SYNTHROID) 75 MCG tablet TAKE 1 TABLET DAILY 90 tablet 3  . loratadine (CLARITIN) 10 MG tablet     . Sodium Chloride-Xylitol (XLEAR SINUS CARE SPRAY NA) Place into the  nose.     No current facility-administered medications on file prior to visit.   No Known Allergies Social History   Socioeconomic History  . Marital status: Married    Spouse name: Not on file  . Number of children: Not on file  . Years of education: Not on file  . Highest education level: Not on file  Occupational History  . Not on file  Tobacco Use  . Smoking status: Former Smoker    Types: Cigarettes    Quit date: 02/24/1992    Years since quitting: 28.7  . Smokeless tobacco: Never Used  Substance and Sexual Activity  . Alcohol use: No  . Drug use: No  . Sexual activity: Not on file  Other Topics Concern  . Not on file  Social History Narrative  . Not on file   Social Determinants of Health   Financial Resource Strain:   . Difficulty of Paying Living Expenses: Not on file  Food Insecurity:   . Worried About Charity fundraiser in the Last Year: Not on file  . Ran Out of Food in the Last Year: Not on file  Transportation Needs:   . Lack of Transportation (Medical): Not on file  . Lack of Transportation (Non-Medical):  Not on file  Physical Activity:   . Days of Exercise per Week: Not on file  . Minutes of Exercise per Session: Not on file  Stress:   . Feeling of Stress : Not on file  Social Connections:   . Frequency of Communication with Friends and Family: Not on file  . Frequency of Social Gatherings with Friends and Family: Not on file  . Attends Religious Services: Not on file  . Active Member of Clubs or Organizations: Not on file  . Attends Archivist Meetings: Not on file  . Marital Status: Not on file  Intimate Partner Violence:   . Fear of Current or Ex-Partner: Not on file  . Emotionally Abused: Not on file  . Physically Abused: Not on file  . Sexually Abused: Not on file      Review of Systems  Gastrointestinal: Positive for diarrhea.  All other systems reviewed and are negative.      Objective:   Physical Exam Vitals  reviewed.  Cardiovascular:     Rate and Rhythm: Normal rate and regular rhythm.     Heart sounds: Normal heart sounds.  Pulmonary:     Effort: Pulmonary effort is normal. No respiratory distress.     Breath sounds: Normal breath sounds. No wheezing or rales.  Abdominal:     General: Abdomen is flat. Bowel sounds are normal. There is no distension.     Palpations: Abdomen is soft. There is no mass.     Tenderness: There is no abdominal tenderness. There is no right CVA tenderness, left CVA tenderness, guarding or rebound.     Hernia: No hernia is present.  Musculoskeletal:     Cervical back: Neck supple.  Lymphadenopathy:     Cervical: No cervical adenopathy.           Assessment & Plan:  Diarrhea of presumed infectious origin - Plan: Stool culture, C. difficile GDH and Toxin A/B  Differential diagnosis includes viral gastroenteritis.  If so this should be resolving over the next 24 to 48 hours.  Bacterial gastroenteritis such as Salmonella, Shigella, Campylobacter.  I feel that this is highly unlikely given no recent travel.  C. difficile colitis.  I am very concerned about this given her recent antibiotic use.  Therefore I will check stool for C. difficile GDH and toxin AMB.  I will also check stool culture.  Also could be antibiotic associated diarrhea and for this reason I will start her on a probiotic, Florastor 250 mg p.o. twice daily for the next 4 weeks.  Await the results of her stool test.

## 2020-11-02 ENCOUNTER — Other Ambulatory Visit: Payer: Medicare HMO

## 2020-11-02 DIAGNOSIS — R197 Diarrhea, unspecified: Secondary | ICD-10-CM | POA: Diagnosis not present

## 2020-11-03 LAB — C. DIFFICILE GDH AND TOXIN A/B
GDH ANTIGEN: DETECTED
MICRO NUMBER:: 11166664
SPECIMEN QUALITY:: ADEQUATE
TOXIN A AND B: DETECTED

## 2020-11-05 ENCOUNTER — Other Ambulatory Visit: Payer: Self-pay | Admitting: Family Medicine

## 2020-11-05 MED ORDER — VANCOMYCIN HCL 125 MG PO CAPS: 125.0000 mg | ORAL_CAPSULE | Freq: Four times a day (QID) | ORAL | 0 refills | Status: DC

## 2020-11-06 LAB — SALMONELLA/SHIGELLA CULT, CAMPY EIA AND SHIGA TOXIN RFL ECOLI
MICRO NUMBER: 11166637
MICRO NUMBER:: 11166638
MICRO NUMBER:: 11166639
Result:: NOT DETECTED
SHIGA RESULT:: NOT DETECTED
SPECIMEN QUALITY: ADEQUATE
SPECIMEN QUALITY:: ADEQUATE
SPECIMEN QUALITY:: ADEQUATE

## 2020-12-24 DIAGNOSIS — R69 Illness, unspecified: Secondary | ICD-10-CM | POA: Diagnosis not present

## 2020-12-27 DIAGNOSIS — Z6823 Body mass index (BMI) 23.0-23.9, adult: Secondary | ICD-10-CM | POA: Diagnosis not present

## 2020-12-27 DIAGNOSIS — Z124 Encounter for screening for malignant neoplasm of cervix: Secondary | ICD-10-CM | POA: Diagnosis not present

## 2021-01-07 DIAGNOSIS — Z124 Encounter for screening for malignant neoplasm of cervix: Secondary | ICD-10-CM | POA: Diagnosis not present

## 2021-02-01 ENCOUNTER — Encounter: Payer: Self-pay | Admitting: Family Medicine

## 2021-02-01 ENCOUNTER — Ambulatory Visit (INDEPENDENT_AMBULATORY_CARE_PROVIDER_SITE_OTHER): Payer: Medicare HMO | Admitting: Family Medicine

## 2021-02-01 ENCOUNTER — Other Ambulatory Visit: Payer: Self-pay

## 2021-02-01 VITALS — BP 110/58 | HR 79 | Temp 97.9°F | Ht 66.0 in | Wt 145.0 lb

## 2021-02-01 DIAGNOSIS — E039 Hypothyroidism, unspecified: Secondary | ICD-10-CM | POA: Diagnosis not present

## 2021-02-01 DIAGNOSIS — I10 Essential (primary) hypertension: Secondary | ICD-10-CM

## 2021-02-01 DIAGNOSIS — Z0000 Encounter for general adult medical examination without abnormal findings: Secondary | ICD-10-CM

## 2021-02-01 DIAGNOSIS — Z0001 Encounter for general adult medical examination with abnormal findings: Secondary | ICD-10-CM

## 2021-02-01 NOTE — Progress Notes (Signed)
Subjective:    Patient ID: Marilyn [name], female    DOB: 1946/05/21, 75 y.o.   MRN: [id number]  HPI Patient is here today for complete physical exam.  Recently had C. difficile colitis in November.  Subsequently resolved after taking vancomycin.  She gets her mammogram through her gynecologist.  This is being scheduled for later this year.  She gets her Pap smear through her gynecologist as well.  Her last colonoscopy was 2013 and is not yet due.  She denies any melena or hematochezia or abdominal pain.  Her last bone density test was in 2020.  T score was -2.7 in the lumbar spine.  She is due to repeat the this year.  She previously took Actonel however after having been on the medicine for a 5 years, her gynecologist who is managing her osteoporosis had her take a holiday from the medication.  She is still taking vitamin D and calcium.  I reviewed her immunizations.  She is had her flu shot and all 3 doses of her COVID vaccine.  She is due for a tetanus shot which she politely declines today.  She is also due for the shingles vaccine.  She denies any falls or depression or memory loss. Past Medical History:  Diagnosis Date  . Bulging disc   . Hypertension   . Osteoporosis   . Rosacea   . Thyroid disease    Past Surgical History:  Procedure Laterality Date  . BILATERAL TEMPOROMANDIBULAR JOINT ARTHROPLASTY    . TUBAL LIGATION     Current Outpatient Medications on File Prior to Visit  Medication Sig Dispense Refill  . Ascorbic Acid (VITAMIN C) 100 MG tablet Take 100 mg daily by mouth.    Marland Kitchen aspirin 81 MG tablet Take 81 mg by mouth daily.    . benazepril (LOTENSIN) 20 MG tablet TAKE 1 TABLET DAILY 90 tablet 3  . BIOTIN PO Take 1 tablet by mouth daily.    . calcium carbonate (OS-CAL) 600 MG TABS Take 600 mg by mouth daily.    . Cholecalciferol (VITAMIN D) 2000 UNITS tablet Take 2,000 Units by mouth daily.    . Glucosamine-Chondroitin (COSAMIN DS PO) Take by mouth daily. Take 1 pill on odd  days and 2 pills on even days    . levothyroxine (SYNTHROID) 75 MCG tablet TAKE 1 TABLET DAILY 90 tablet 3  . loratadine (CLARITIN) 10 MG tablet     . Sodium Chloride-Xylitol (XLEAR SINUS CARE SPRAY NA) Place into the nose.    . metroNIDAZOLE (FLAGYL) 500 MG tablet Take 1 tablet (500 mg total) by mouth 3 (three) times daily. (Patient not taking: Reported on 02/01/2021) 21 tablet 0  . saccharomyces boulardii (FLORASTOR) 250 MG capsule Take 1 capsule (250 mg total) by mouth 2 (two) times daily. (Patient not taking: Reported on 02/01/2021) 60 capsule 0  . vancomycin (VANCOCIN) 125 MG capsule Take 1 capsule (125 mg total) by mouth 4 (four) times daily. (Patient not taking: Reported on 02/01/2021) 56 capsule 0   No current facility-administered medications on file prior to visit.   Allergies  Allergen Reactions  . Other Diarrhea   Social History   Socioeconomic History  . Marital status: Married    Spouse name: Not on file  . Number of children: Not on file  . Years of education: Not on file  . Highest education level: Not on file  Occupational History  . Not on file  Tobacco Use  . Smoking status: Former  Smoker    Types: Cigarettes    Quit date: 02/24/1992    Years since quitting: 28.9  . Smokeless tobacco: Never Used  Substance and Sexual Activity  . Alcohol use: No  . Drug use: No  . Sexual activity: Not on file  Other Topics Concern  . Not on file  Social History Narrative  . Not on file   Social Determinants of Health   Financial Resource Strain: Not on file  Food Insecurity: Not on file  Transportation Needs: Not on file  Physical Activity: Not on file  Stress: Not on file  Social Connections: Not on file  Intimate Partner Violence: Not on file   Family History  Problem Relation Age of Onset  . Colon cancer Neg Hx       Review of Systems  All other systems reviewed and are negative.      Objective:   Physical Exam Vitals reviewed.  Constitutional:       General: She is not in acute distress.    Appearance: She is well-developed. She is not diaphoretic.  HENT:     Head: Normocephalic and atraumatic.     Right Ear: External ear normal.     Left Ear: External ear normal.     Nose: Nose normal.     Mouth/Throat:     Pharynx: No oropharyngeal exudate.  Eyes:     General: No scleral icterus.       Right eye: No discharge.        Left eye: No discharge.     Conjunctiva/sclera: Conjunctivae normal.     Pupils: Pupils are equal, round, and reactive to light.  Neck:     Thyroid: No thyromegaly.     Vascular: No JVD.     Trachea: No tracheal deviation.  Cardiovascular:     Rate and Rhythm: Normal rate and regular rhythm.     Heart sounds: Normal heart sounds. No murmur heard. No friction rub. No gallop.   Pulmonary:     Effort: Pulmonary effort is normal. No respiratory distress.     Breath sounds: Normal breath sounds. No stridor. No wheezing or rales.  Chest:     Chest wall: No tenderness.  Abdominal:     General: Bowel sounds are normal. There is no distension.     Palpations: Abdomen is soft. There is no mass.     Tenderness: There is no abdominal tenderness. There is no guarding or rebound.  Musculoskeletal:        General: No tenderness. Normal range of motion.     Cervical back: Normal range of motion and neck supple.  Lymphadenopathy:     Cervical: No cervical adenopathy.  Skin:    General: Skin is warm.     Coloration: Skin is not pale.     Findings: No erythema or rash.  Neurological:     Mental Status: She is alert and oriented to person, place, and time.     Cranial Nerves: No cranial nerve deficit.     Motor: No abnormal muscle tone.     Coordination: Coordination normal.     Deep Tendon Reflexes: Reflexes are normal and symmetric.  Psychiatric:        Behavior: Behavior normal.        Thought Content: Thought content normal.        Judgment: Judgment normal.           Assessment & Plan:  General medical  exam  Hypothyroidism, unspecified  type - Plan: TSH  Essential hypertension - Plan: COMPLETE METABOLIC PANEL WITH GFR, Lipid panel  Continue to encourage the shingles vaccine.  Patient politely declined tetanus shot.  Colonoscopy is due next year.  Mammogram and bone density I will defer to her gynecologist who she is seeing later this year.  Continue to take calcium and vitamin D.  She denies any memory loss, falls, depression.  Blood pressures acceptable.  Return fasting for a CMP, lipid panel, and TSH to monitor her cholesterol along with the management of her hypothyroidism.  Otherwise regular anticipatory guidance is provided

## 2021-02-26 DIAGNOSIS — Z1231 Encounter for screening mammogram for malignant neoplasm of breast: Secondary | ICD-10-CM | POA: Diagnosis not present

## 2021-03-18 DIAGNOSIS — H5203 Hypermetropia, bilateral: Secondary | ICD-10-CM | POA: Diagnosis not present

## 2021-03-28 ENCOUNTER — Other Ambulatory Visit: Payer: Self-pay | Admitting: *Deleted

## 2021-03-28 MED ORDER — BENAZEPRIL HCL 20 MG PO TABS: 20.0000 mg | ORAL_TABLET | Freq: Every day | ORAL | 3 refills | Status: DC

## 2021-03-28 MED ORDER — LEVOTHYROXINE SODIUM 75 MCG PO TABS: 75.0000 ug | ORAL_TABLET | Freq: Every day | ORAL | 3 refills | Status: DC

## 2021-04-23 DIAGNOSIS — Z872 Personal history of diseases of the skin and subcutaneous tissue: Secondary | ICD-10-CM | POA: Diagnosis not present

## 2021-04-23 DIAGNOSIS — L821 Other seborrheic keratosis: Secondary | ICD-10-CM | POA: Diagnosis not present

## 2021-04-23 DIAGNOSIS — D1801 Hemangioma of skin and subcutaneous tissue: Secondary | ICD-10-CM | POA: Diagnosis not present

## 2021-04-23 DIAGNOSIS — L905 Scar conditions and fibrosis of skin: Secondary | ICD-10-CM | POA: Diagnosis not present

## 2021-04-23 DIAGNOSIS — D485 Neoplasm of uncertain behavior of skin: Secondary | ICD-10-CM | POA: Diagnosis not present

## 2021-04-23 DIAGNOSIS — D225 Melanocytic nevi of trunk: Secondary | ICD-10-CM | POA: Diagnosis not present

## 2021-04-23 DIAGNOSIS — L570 Actinic keratosis: Secondary | ICD-10-CM | POA: Diagnosis not present

## 2021-04-23 DIAGNOSIS — L814 Other melanin hyperpigmentation: Secondary | ICD-10-CM | POA: Diagnosis not present

## 2021-05-28 ENCOUNTER — Encounter: Payer: Self-pay | Admitting: Orthopaedic Surgery

## 2021-05-28 ENCOUNTER — Ambulatory Visit: Payer: Medicare HMO | Admitting: Orthopaedic Surgery

## 2021-05-28 ENCOUNTER — Ambulatory Visit (INDEPENDENT_AMBULATORY_CARE_PROVIDER_SITE_OTHER): Payer: Medicare HMO

## 2021-05-28 VITALS — BP 139/79 | HR 76 | Ht 66.0 in | Wt 145.0 lb

## 2021-05-28 DIAGNOSIS — M533 Sacrococcygeal disorders, not elsewhere classified: Secondary | ICD-10-CM | POA: Diagnosis not present

## 2021-05-28 DIAGNOSIS — M25562 Pain in left knee: Secondary | ICD-10-CM | POA: Diagnosis not present

## 2021-05-28 DIAGNOSIS — M25561 Pain in right knee: Secondary | ICD-10-CM

## 2021-05-28 DIAGNOSIS — G8929 Other chronic pain: Secondary | ICD-10-CM | POA: Diagnosis not present

## 2021-05-28 NOTE — Progress Notes (Signed)
Office Visit Note   Patient: Marilyn [name]           Date of Birth: 1946-10-03           MRN: [id number] Visit Date: 05/28/2021              Requested by: Susy Frizzle, MD [id number] [facility] [place],  [place] [address] PCP: Susy Frizzle, MD   Assessment & Plan: Visit Diagnoses:  1. Coccyx pain   2. Chronic pain of both knees     Plan: We reviewed x-rays findings on exam.  She should get improvement with continued conservative treatment.  She can return if she has increased knee symptoms and consider intra-articular injection if she has acute flare and has trouble walking.  No evidence of coccyx fracture.  Follow-Up Instructions: No follow-ups on file.   Orders:  Orders Placed This Encounter  Procedures  . XR KNEE 3 VIEW LEFT  . XR Sacrum/Coccyx   No orders of the defined types were placed in this encounter.     Procedures: No procedures performed   Clinical Data: No additional findings.   Subjective: Chief Complaint  Patient presents with  . Left Knee - Pain  . Right Knee - Pain  . Tailbone Pain    Fall 05/19/2021    HPI 75 year old female with fall 05/19/2021 with bilateral knee pain and coccyx pain.  Patient's had more symptoms on left than right knee and now left knee appears to be chronically hurting.  More pain medially sometimes laterally.  Occasional popping and locking.  She slipped off a hammock when she fell landing on her coccyx with increased pain.  This occurred 9 days ago on 05/19/2021.  Patient states she thought she was improving then sat on an outdoor pool chair which is very painful and now she is having recurrent symptoms as well.  Patient has some hypertension history of dizziness hypothyroidism.  Review of Systems   Objective: Vital Signs: BP 139/79   Pulse 76   Ht 5\' 6"  (1.676 m)   Wt 145 lb (65.8 kg)   BMI 23.40 kg/m   Physical Exam Constitutional:      Appearance: She is well-developed.  HENT:     Head:  Normocephalic.     Right Ear: External ear normal.     Left Ear: External ear normal.  Eyes:     Pupils: Pupils are equal, round, and reactive to light.  Neck:     Thyroid: No thyromegaly.     Trachea: No tracheal deviation.  Cardiovascular:     Rate and Rhythm: Normal rate.  Pulmonary:     Effort: Pulmonary effort is normal.  Abdominal:     Palpations: Abdomen is soft.  Skin:    General: Skin is warm and dry.  Neurological:     Mental Status: She is alert and oriented to person, place, and time.  Psychiatric:        Behavior: Behavior normal.     Ortho Exam lower extremity reflexes are 2+ she has some tenderness in the coccyx without abnormal mobility and no ecchymosis.  No sciatic notch tenderness negative logroll to the hips.  Specialty Comments:  No specialty comments available.  Imaging: No results found.   PMFS History: Patient Active Problem List   Diagnosis Date Noted  . Hypothyroidism 06/02/2014  . Dizziness and giddiness 01/31/2014  . Hypertension    Past Medical History:  Diagnosis Date  . Bulging disc   .  Hypertension   . Osteoporosis   . Rosacea   . Thyroid disease     Family History  Problem Relation Age of Onset  . Colon cancer Neg Hx     Past Surgical History:  Procedure Laterality Date  . BILATERAL TEMPOROMANDIBULAR JOINT ARTHROPLASTY    . TUBAL LIGATION     Social History   Occupational History  . Not on file  Tobacco Use  . Smoking status: Former Smoker    Types: Cigarettes    Quit date: 02/24/1992    Years since quitting: 29.2  . Smokeless tobacco: Never Used  Substance and Sexual Activity  . Alcohol use: No  . Drug use: No  . Sexual activity: Not on file

## 2021-07-23 DIAGNOSIS — L810 Postinflammatory hyperpigmentation: Secondary | ICD-10-CM | POA: Diagnosis not present

## 2021-07-23 DIAGNOSIS — D1801 Hemangioma of skin and subcutaneous tissue: Secondary | ICD-10-CM | POA: Diagnosis not present

## 2021-11-19 DIAGNOSIS — L820 Inflamed seborrheic keratosis: Secondary | ICD-10-CM | POA: Diagnosis not present

## 2021-11-19 DIAGNOSIS — D485 Neoplasm of uncertain behavior of skin: Secondary | ICD-10-CM | POA: Diagnosis not present

## 2022-01-06 DIAGNOSIS — Z01419 Encounter for gynecological examination (general) (routine) without abnormal findings: Secondary | ICD-10-CM | POA: Diagnosis not present

## 2022-01-06 DIAGNOSIS — Z6824 Body mass index (BMI) 24.0-24.9, adult: Secondary | ICD-10-CM | POA: Diagnosis not present

## 2022-03-04 DIAGNOSIS — Z1211 Encounter for screening for malignant neoplasm of colon: Secondary | ICD-10-CM | POA: Diagnosis not present

## 2022-03-04 DIAGNOSIS — Z1231 Encounter for screening mammogram for malignant neoplasm of breast: Secondary | ICD-10-CM | POA: Diagnosis not present

## 2022-03-11 LAB — EXTERNAL GENERIC LAB PROCEDURE: COLOGUARD: NEGATIVE

## 2022-03-11 LAB — COLOGUARD: COLOGUARD: NEGATIVE

## 2022-03-27 DIAGNOSIS — H5213 Myopia, bilateral: Secondary | ICD-10-CM | POA: Diagnosis not present

## 2022-03-27 DIAGNOSIS — Z0100 Encounter for examination of eyes and vision without abnormal findings: Secondary | ICD-10-CM | POA: Diagnosis not present

## 2022-04-24 DIAGNOSIS — D225 Melanocytic nevi of trunk: Secondary | ICD-10-CM | POA: Diagnosis not present

## 2022-04-24 DIAGNOSIS — L821 Other seborrheic keratosis: Secondary | ICD-10-CM | POA: Diagnosis not present

## 2022-04-24 DIAGNOSIS — L814 Other melanin hyperpigmentation: Secondary | ICD-10-CM | POA: Diagnosis not present

## 2022-05-05 ENCOUNTER — Other Ambulatory Visit: Payer: Medicare HMO

## 2022-05-05 DIAGNOSIS — Z136 Encounter for screening for cardiovascular disorders: Secondary | ICD-10-CM

## 2022-05-05 DIAGNOSIS — I10 Essential (primary) hypertension: Secondary | ICD-10-CM | POA: Diagnosis not present

## 2022-05-05 DIAGNOSIS — E039 Hypothyroidism, unspecified: Secondary | ICD-10-CM

## 2022-05-06 LAB — LIPID PANEL
Cholesterol: 208 mg/dL — ABNORMAL HIGH (ref <200)
HDL: 53 mg/dL (ref >=50)
LDL Cholesterol (Calc): 135 mg/dL (calc) — ABNORMAL HIGH
Non-HDL Cholesterol (Calc): 155 mg/dL (calc) — ABNORMAL HIGH (ref <130)
Total CHOL/HDL Ratio: 3.9 (calc) (ref <5.0)
Triglycerides: 98 mg/dL (ref <150)

## 2022-05-06 LAB — CBC WITH DIFFERENTIAL/PLATELET
Absolute Monocytes: 623 cells/uL (ref 200–950)
Basophils Absolute: 28 cells/uL (ref 0–200)
Basophils Relative: 0.4 %
Eosinophils Absolute: 203 cells/uL (ref 15–500)
Eosinophils Relative: 2.9 %
HCT: 41.1 % (ref 35.0–45.0)
Hemoglobin: 13.6 g/dL (ref 11.7–15.5)
Lymphs Abs: 1722 cells/uL (ref 850–3900)
MCH: 31.4 pg (ref 27.0–33.0)
MCHC: 33.1 g/dL (ref 32.0–36.0)
MCV: 94.9 fL (ref 80.0–100.0)
MPV: 10.1 fL (ref 7.5–12.5)
Monocytes Relative: 8.9 %
Neutro Abs: 4424 cells/uL (ref 1500–7800)
Neutrophils Relative %: 63.2 %
Platelets: 304 10*3/uL (ref 140–400)
RBC: 4.33 10*6/uL (ref 3.80–5.10)
RDW: 11.8 % (ref 11.0–15.0)
Total Lymphocyte: 24.6 %
WBC: 7 10*3/uL (ref 3.8–10.8)

## 2022-05-06 LAB — COMPREHENSIVE METABOLIC PANEL
AG Ratio: 1.4 (calc) (ref 1.0–2.5)
ALT: 12 U/L (ref 6–29)
AST: 18 U/L (ref 10–35)
Albumin: 4 g/dL (ref 3.6–5.1)
Alkaline phosphatase (APISO): 67 U/L (ref 37–153)
BUN: 18 mg/dL (ref 7–25)
CO2: 27 mmol/L (ref 20–32)
Calcium: 9.8 mg/dL (ref 8.6–10.4)
Chloride: 103 mmol/L (ref 98–110)
Creat: 0.89 mg/dL (ref 0.60–1.00)
Globulin: 2.9 g/dL (calc) (ref 1.9–3.7)
Glucose, Bld: 127 mg/dL — ABNORMAL HIGH (ref 65–99)
Potassium: 4.6 mmol/L (ref 3.5–5.3)
Sodium: 139 mmol/L (ref 135–146)
Total Bilirubin: 0.5 mg/dL (ref 0.2–1.2)
Total Protein: 6.9 g/dL (ref 6.1–8.1)

## 2022-05-06 LAB — TSH: TSH: 1.06 mIU/L (ref 0.40–4.50)

## 2022-05-12 ENCOUNTER — Encounter: Payer: Self-pay | Admitting: Family Medicine

## 2022-05-12 ENCOUNTER — Ambulatory Visit (INDEPENDENT_AMBULATORY_CARE_PROVIDER_SITE_OTHER): Payer: Medicare HMO | Admitting: Family Medicine

## 2022-05-12 VITALS — BP 120/76 | HR 73 | Temp 98.0°F | Ht 66.0 in | Wt 154.8 lb

## 2022-05-12 DIAGNOSIS — E039 Hypothyroidism, unspecified: Secondary | ICD-10-CM | POA: Diagnosis not present

## 2022-05-12 DIAGNOSIS — R739 Hyperglycemia, unspecified: Secondary | ICD-10-CM | POA: Diagnosis not present

## 2022-05-12 DIAGNOSIS — Z0000 Encounter for general adult medical examination without abnormal findings: Secondary | ICD-10-CM

## 2022-05-12 DIAGNOSIS — I10 Essential (primary) hypertension: Secondary | ICD-10-CM | POA: Diagnosis not present

## 2022-05-12 DIAGNOSIS — Z1211 Encounter for screening for malignant neoplasm of colon: Secondary | ICD-10-CM

## 2022-05-12 NOTE — Progress Notes (Signed)
? ?Subjective:  ? ? Patient ID: Marilyn [name], female    DOB: Apr 01, 1946, 76 y.o.   MRN: [id number] ? ?HPI ?Patient is here today for complete physical exam.  ?Colonoscopy 2013 ?DEXA 2020- T was -2.7 ?  Patient is concerned about short-term memory loss.  Her mother had dementia.  2 of her sisters had dementia.  She states that she has noticed that she is coming more forgetful.  For instance, she states that occasionally she has a hard time thinking of the words that she wants to say or remembering someone's name.  She denies forgetting conversations.  She denies forgetting to pay bills.  She denies getting lost.  She denies forgetting to turn off the water or turn off appliances.  Her husband has not noticed any memory problems or concerns.  At the present time, it is too early to truly to say.  Together we decided to monitor the situation to see if there is any evidence of any progression.  She is due for the shingles shot.  She is due for COVID booster.  She is due for tetanus shot.  We discussed all 3 of these.  She is due for colonoscopy however she had Cologuard which was negative so she is not due for colon cancer screening for another 3 years.  She believes she had a bone density test last year at her gynecologist and she will check on that. ?Immunization History  ?Administered Date(s) Administered  ? Influenza, High Dose Seasonal PF 10/07/2017  ? Influenza,inj,Quad PF,6+ Mos 01/04/2014, 11/06/2015, 09/07/2018  ? Influenza-Unspecified 10/20/2016  ? PFIZER(Purple Top)SARS-COV-2 Vaccination 02/20/2020, 03/12/2020  ? Pneumococcal Conjugate-13 06/21/2015  ? Pneumococcal Polysaccharide-23 10/26/2012  ? Tdap 03/27/2011  ? ? ?Lab on 05/05/2022  ?Component Date Value Ref Range Status  ? TSH 05/05/2022 1.06  0.40 - 4.50 mIU/L Final  ? Cholesterol 05/05/2022 208 (H)  <200 mg/dL Final  ? HDL 05/05/2022 53  > OR = 50 mg/dL Final  ? Triglycerides 05/05/2022 98  <150 mg/dL Final  ? LDL Cholesterol (Calc) 05/05/2022 135 (H)   mg/dL (calc) Final  ? Comment: Reference range: <100 ?Marland Kitchen ?Desirable range <100 mg/dL for primary prevention;   ?<70 mg/dL for patients with CHD or diabetic patients  ?with > or = 2 CHD risk factors. ?. ?LDL-C is now calculated using the Martin-Hopkins  ?calculation, which is a validated novel method providing  ?better accuracy than the Friedewald equation in the  ?estimation of LDL-C.  ?Cresenciano Genre et al. Annamaria Helling. 1950;932(67): 2061-2068  ?(http://education.[facility].com/faq/FAQ164) ?  ? Total CHOL/HDL Ratio 05/05/2022 3.9  <5.0 (calc) Final  ? Non-HDL Cholesterol (Calc) 05/05/2022 155 (H)  <130 mg/dL (calc) Final  ? Comment: For patients with diabetes plus 1 major ASCVD risk  ?factor, treating to a non-HDL-C goal of <100 mg/dL  ?(LDL-C of <70 mg/dL) is considered a therapeutic  ?option. ?  ? Glucose, Bld 05/05/2022 127 (H)  65 - 99 mg/dL Final  ? Comment: . ?           Fasting reference interval ?. ?For someone without known diabetes, a glucose ?value >125 mg/dL indicates that they may have ?diabetes and this should be confirmed with a ?follow-up test. ?. ?  ? BUN 05/05/2022 18  7 - 25 mg/dL Final  ? Creat 05/05/2022 0.89  0.60 - 1.00 mg/dL Final  ? BUN/Creatinine Ratio 12/45/8099 NOT APPLICABLE  6 - 22 (calc) Final  ? Sodium 05/05/2022 139  135 - 146 mmol/L Final  ?  Potassium 05/05/2022 4.6  3.5 - 5.3 mmol/L Final  ? Chloride 05/05/2022 103  98 - 110 mmol/L Final  ? CO2 05/05/2022 27  20 - 32 mmol/L Final  ? Calcium 05/05/2022 9.8  8.6 - 10.4 mg/dL Final  ? Total Protein 05/05/2022 6.9  6.1 - 8.1 g/dL Final  ? Albumin 05/05/2022 4.0  3.6 - 5.1 g/dL Final  ? Globulin 05/05/2022 2.9  1.9 - 3.7 g/dL (calc) Final  ? AG Ratio 05/05/2022 1.4  1.0 - 2.5 (calc) Final  ? Total Bilirubin 05/05/2022 0.5  0.2 - 1.2 mg/dL Final  ? Alkaline phosphatase (APISO) 05/05/2022 67  37 - 153 U/L Final  ? AST 05/05/2022 18  10 - 35 U/L Final  ? ALT 05/05/2022 12  6 - 29 U/L Final  ? WBC 05/05/2022 7.0  3.8 - 10.8 Thousand/uL Final  ?  RBC 05/05/2022 4.33  3.80 - 5.10 Million/uL Final  ? Hemoglobin 05/05/2022 13.6  11.7 - 15.5 g/dL Final  ? HCT 05/05/2022 41.1  35.0 - 45.0 % Final  ? MCV 05/05/2022 94.9  80.0 - 100.0 fL Final  ? MCH 05/05/2022 31.4  27.0 - 33.0 pg Final  ? MCHC 05/05/2022 33.1  32.0 - 36.0 g/dL Final  ? RDW 05/05/2022 11.8  11.0 - 15.0 % Final  ? Platelets 05/05/2022 304  140 - 400 Thousand/uL Final  ? MPV 05/05/2022 10.1  7.5 - 12.5 fL Final  ? Neutro Abs 05/05/2022 4,424  1,500 - 7,800 cells/uL Final  ? Lymphs Abs 05/05/2022 1,722  850 - 3,900 cells/uL Final  ? Absolute Monocytes 05/05/2022 623  200 - 950 cells/uL Final  ? Eosinophils Absolute 05/05/2022 203  15 - 500 cells/uL Final  ? Basophils Absolute 05/05/2022 28  0 - 200 cells/uL Final  ? Neutrophils Relative % 05/05/2022 63.2  % Final  ? Total Lymphocyte 05/05/2022 24.6  % Final  ? Monocytes Relative 05/05/2022 8.9  % Final  ? Eosinophils Relative 05/05/2022 2.9  % Final  ? Basophils Relative 05/05/2022 0.4  % Final  ? ? ?Past Medical History:  ?Diagnosis Date  ? Bulging disc   ? Hypertension   ? Osteoporosis   ? Rosacea   ? Thyroid disease   ? ?Past Surgical History:  ?Procedure Laterality Date  ? BILATERAL TEMPOROMANDIBULAR JOINT ARTHROPLASTY    ? TUBAL LIGATION    ? ?Current Outpatient Medications on File Prior to Visit  ?Medication Sig Dispense Refill  ? Ascorbic Acid (VITAMIN C) 100 MG tablet Take 100 mg daily by mouth.    ? aspirin 81 MG tablet Take 81 mg by mouth daily.    ? benazepril (LOTENSIN) 20 MG tablet Take 1 tablet (20 mg total) by mouth daily. 90 tablet 3  ? BIOTIN PO Take 1 tablet by mouth daily.    ? calcium carbonate (OS-CAL) 600 MG TABS Take 600 mg by mouth daily.    ? Cholecalciferol (VITAMIN D) 2000 UNITS tablet Take 2,000 Units by mouth daily.    ? Glucosamine-Chondroitin (COSAMIN DS PO) Take by mouth daily. Take 1 pill on odd days and 2 pills on even days    ? levothyroxine (SYNTHROID) 75 MCG tablet Take 1 tablet (75 mcg total) by mouth daily. 90  tablet 3  ? loratadine (CLARITIN) 10 MG tablet     ? metroNIDAZOLE (FLAGYL) 500 MG tablet Take 1 tablet (500 mg total) by mouth 3 (three) times daily. 21 tablet 0  ? saccharomyces boulardii (FLORASTOR) 250 MG capsule  Take 1 capsule (250 mg total) by mouth 2 (two) times daily. 60 capsule 0  ? Sodium Chloride-Xylitol (XLEAR SINUS CARE SPRAY NA) Place into the nose.    ? vancomycin (VANCOCIN) 125 MG capsule Take 1 capsule (125 mg total) by mouth 4 (four) times daily. 56 capsule 0  ? ?No current facility-administered medications on file prior to visit.  ? ?Allergies  ?Allergen Reactions  ? Other Diarrhea  ? ?Social History  ? ?Socioeconomic History  ? Marital status: Married  ?  Spouse name: Not on file  ? Number of children: Not on file  ? Years of education: Not on file  ? Highest education level: Not on file  ?Occupational History  ? Not on file  ?Tobacco Use  ? Smoking status: Former  ?  Types: Cigarettes  ?  Quit date: 02/24/1992  ?  Years since quitting: 30.2  ? Smokeless tobacco: Never  ?Substance and Sexual Activity  ? Alcohol use: No  ? Drug use: No  ? Sexual activity: Not on file  ?Other Topics Concern  ? Not on file  ?Social History Narrative  ? Not on file  ? ?Social Determinants of Health  ? ?Financial Resource Strain: Not on file  ?Food Insecurity: Not on file  ?Transportation Needs: Not on file  ?Physical Activity: Not on file  ?Stress: Not on file  ?Social Connections: Not on file  ?Intimate Partner Violence: Not on file  ? ?Family History  ?Problem Relation Age of Onset  ? Colon cancer Neg Hx   ? ? ? ? ?Review of Systems  ?All other systems reviewed and are negative. ? ?   ?Objective:  ? Physical Exam ?Vitals reviewed.  ?Constitutional:   ?   General: She is not in acute distress. ?   Appearance: She is well-developed. She is not diaphoretic.  ?HENT:  ?   Head: Normocephalic and atraumatic.  ?   Right Ear: External ear normal.  ?   Left Ear: External ear normal.  ?   Nose: Nose normal.  ?   Mouth/Throat:   ?   Pharynx: No oropharyngeal exudate.  ?Eyes:  ?   General: No scleral icterus.    ?   Right eye: No discharge.     ?   Left eye: No discharge.  ?   Conjunctiva/sclera: Conjunctivae normal.  ?   Pu

## 2022-05-13 ENCOUNTER — Other Ambulatory Visit: Payer: Self-pay

## 2022-05-13 LAB — HEMOGLOBIN A1C
Hgb A1c MFr Bld: 6 % of total Hgb — ABNORMAL HIGH (ref <5.7)
Mean Plasma Glucose: 126 mg/dL
eAG (mmol/L): 7 mmol/L

## 2022-05-13 MED ORDER — ROSUVASTATIN CALCIUM 10 MG PO TABS: 10.0000 mg | ORAL_TABLET | Freq: Every day | ORAL | 1 refills | Status: DC

## 2022-06-20 ENCOUNTER — Ambulatory Visit (INDEPENDENT_AMBULATORY_CARE_PROVIDER_SITE_OTHER): Payer: Medicare HMO | Admitting: Family Medicine

## 2022-06-20 VITALS — BP 130/72 | HR 72 | Temp 97.8°F | Ht 66.0 in | Wt 152.0 lb

## 2022-06-20 DIAGNOSIS — I8002 Phlebitis and thrombophlebitis of superficial vessels of left lower extremity: Secondary | ICD-10-CM

## 2022-09-24 DIAGNOSIS — N390 Urinary tract infection, site not specified: Secondary | ICD-10-CM | POA: Diagnosis not present

## 2022-11-09 ENCOUNTER — Other Ambulatory Visit: Payer: Self-pay | Admitting: Family Medicine

## 2022-11-10 ENCOUNTER — Other Ambulatory Visit: Payer: Medicare HMO

## 2022-11-10 DIAGNOSIS — R739 Hyperglycemia, unspecified: Secondary | ICD-10-CM | POA: Diagnosis not present

## 2022-11-10 DIAGNOSIS — E039 Hypothyroidism, unspecified: Secondary | ICD-10-CM

## 2022-11-10 DIAGNOSIS — Z13220 Encounter for screening for lipoid disorders: Secondary | ICD-10-CM

## 2022-11-10 DIAGNOSIS — I10 Essential (primary) hypertension: Secondary | ICD-10-CM | POA: Diagnosis not present

## 2022-11-10 LAB — CBC WITH DIFFERENTIAL/PLATELET
Absolute Monocytes: 650 cells/uL (ref 200–950)
Basophils Absolute: 40 cells/uL (ref 0–200)
Basophils Relative: 0.6 %
Eosinophils Absolute: 208 cells/uL (ref 15–500)
Eosinophils Relative: 3.1 %
HCT: 40.4 % (ref 35.0–45.0)
Hemoglobin: 13.6 g/dL (ref 11.7–15.5)
Lymphs Abs: 1896 cells/uL (ref 850–3900)
MCH: 31.2 pg (ref 27.0–33.0)
MCHC: 33.7 g/dL (ref 32.0–36.0)
MCV: 92.7 fL (ref 80.0–100.0)
MPV: 9.9 fL (ref 7.5–12.5)
Monocytes Relative: 9.7 %
Neutro Abs: 3906 cells/uL (ref 1500–7800)
Neutrophils Relative %: 58.3 %
Platelets: 328 10*3/uL (ref 140–400)
RBC: 4.36 10*6/uL (ref 3.80–5.10)
RDW: 11.6 % (ref 11.0–15.0)
Total Lymphocyte: 28.3 %
WBC: 6.7 10*3/uL (ref 3.8–10.8)

## 2022-11-10 LAB — COMPLETE METABOLIC PANEL WITH GFR
AG Ratio: 1.6 (calc) (ref 1.0–2.5)
ALT: 14 U/L (ref 6–29)
AST: 21 U/L (ref 10–35)
Albumin: 4.2 g/dL (ref 3.6–5.1)
Alkaline phosphatase (APISO): 64 U/L (ref 37–153)
BUN: 21 mg/dL (ref 7–25)
CO2: 28 mmol/L (ref 20–32)
Calcium: 9.7 mg/dL (ref 8.6–10.4)
Chloride: 103 mmol/L (ref 98–110)
Creat: 0.87 mg/dL (ref 0.60–1.00)
Globulin: 2.7 g/dL (calc) (ref 1.9–3.7)
Glucose, Bld: 109 mg/dL — ABNORMAL HIGH (ref 65–99)
Potassium: 4.3 mmol/L (ref 3.5–5.3)
Sodium: 139 mmol/L (ref 135–146)
Total Bilirubin: 0.5 mg/dL (ref 0.2–1.2)
Total Protein: 6.9 g/dL (ref 6.1–8.1)
eGFR: 69 mL/min/{1.73_m2} (ref >=60)

## 2022-11-10 LAB — LIPID PANEL
Cholesterol: 150 mg/dL (ref <200)
HDL: 58 mg/dL (ref >=50)
LDL Cholesterol (Calc): 77 mg/dL (calc)
Non-HDL Cholesterol (Calc): 92 mg/dL (calc) (ref <130)
Total CHOL/HDL Ratio: 2.6 (calc) (ref <5.0)
Triglycerides: 69 mg/dL (ref <150)

## 2022-11-10 LAB — MICROALBUMIN / CREATININE URINE RATIO
Creatinine, Urine: 174 mg/dL (ref 20–275)
Microalb Creat Ratio: 11 mcg/mg creat (ref <30)
Microalb, Ur: 1.9 mg/dL

## 2022-11-10 LAB — HEMOGLOBIN A1C
Hgb A1c MFr Bld: 6.4 % of total Hgb — ABNORMAL HIGH (ref <5.7)
Mean Plasma Glucose: 137 mg/dL
eAG (mmol/L): 7.6 mmol/L

## 2022-11-10 LAB — VITAMIN B12: Vitamin B-12: 847 pg/mL (ref 200–1100)

## 2022-11-10 LAB — TSH: TSH: 1.1 mIU/L (ref 0.40–4.50)

## 2022-11-10 NOTE — Telephone Encounter (Signed)
Last OV was in June 2023, will refill.  Requested Prescriptions  Pending Prescriptions Disp Refills   rosuvastatin (CRESTOR) 10 MG tablet [Pharmacy Med Name: ROSUVASTATIN CALCIUM 10 MG TAB] 90 tablet 0    Sig: TAKE 1 TABLET BY MOUTH EVERY DAY     Cardiovascular:  Antilipid - Statins 2 Failed - 11/09/2022  9:16 AM      Failed - Valid encounter within last 12 months    Recent Outpatient Visits           6 months ago General medical exam   [place] Susy Frizzle, MD   1 year ago General medical exam   [place] Susy Frizzle, MD   2 years ago Diarrhea of presumed infectious origin   [place], Cammie Mcgee, MD   2 years ago Recurrent sinus infections   [place], [place] Nunnery, MD   5 years ago Routine general medical examination at a health care facility   [place], Cammie Mcgee, MD       Future Appointments             In 3 days Susy Frizzle, MD [place], [facility]            Failed - Lipid Panel in normal range within the last 12 months    Cholesterol  Date Value Ref Range Status  05/05/2022 208 (H) <200 mg/dL Final   LDL Cholesterol (Calc)  Date Value Ref Range Status  05/05/2022 135 (H) mg/dL (calc) Final    Comment:    Reference range: <100 . Desirable range <100 mg/dL for primary prevention;   <70 mg/dL for patients with CHD or diabetic patients  with > or = 2 CHD risk factors. Marland Kitchen LDL-C is now calculated using the Martin-Hopkins  calculation, which is a validated novel method providing  better accuracy than the Friedewald equation in the  estimation of LDL-C.  Cresenciano Genre et al. Annamaria Helling. 8299;371(69): 2061-2068  (http://education.[facility].com/faq/FAQ164)    HDL  Date Value Ref Range Status  05/05/2022 53 > OR = 50 mg/dL Final   Triglycerides  Date Value Ref Range Status  05/05/2022 98 <150 mg/dL Final          Passed - Cr in normal range and within 360 days    Creat  Date Value Ref Range Status  05/05/2022 0.89 0.60 - 1.00 mg/dL Final         Passed - Patient is not pregnant

## 2022-11-13 ENCOUNTER — Encounter: Payer: Self-pay | Admitting: Family Medicine

## 2022-11-13 ENCOUNTER — Ambulatory Visit (INDEPENDENT_AMBULATORY_CARE_PROVIDER_SITE_OTHER): Payer: Medicare HMO | Admitting: Family Medicine

## 2022-11-13 VITALS — BP 120/62 | HR 81 | Ht 66.0 in | Wt 144.2 lb

## 2022-11-13 DIAGNOSIS — E039 Hypothyroidism, unspecified: Secondary | ICD-10-CM | POA: Diagnosis not present

## 2022-11-13 DIAGNOSIS — I10 Essential (primary) hypertension: Secondary | ICD-10-CM

## 2022-11-13 DIAGNOSIS — R7303 Prediabetes: Secondary | ICD-10-CM | POA: Diagnosis not present

## 2022-11-13 DIAGNOSIS — E7800 Pure hypercholesterolemia, unspecified: Secondary | ICD-10-CM

## 2022-11-13 NOTE — Progress Notes (Signed)
Subjective:    Patient ID: Marilyn [name], female    DOB: 27-Aug-1946, 76 y.o.   MRN: [id number]  Patient is a very pleasant 76 year old Caucasian female here today for checkup.  The last time I saw the patient, she was concerned about some mild memory impairment.  She states that she has not noticed any worsening of her memory.  She denies any short-term memory loss.  She denies any confusion.  Overall she states that she is doing well.  Her most recent lab work is listed below  Lab on 11/10/2022  Component Date Value Ref Range Status   WBC 11/10/2022 6.7  3.8 - 10.8 Thousand/uL Final   RBC 11/10/2022 4.36  3.80 - 5.10 Million/uL Final   Hemoglobin 11/10/2022 13.6  11.7 - 15.5 g/dL Final   HCT 11/10/2022 40.4  35.0 - 45.0 % Final   MCV 11/10/2022 92.7  80.0 - 100.0 fL Final   MCH 11/10/2022 31.2  27.0 - 33.0 pg Final   MCHC 11/10/2022 33.7  32.0 - 36.0 g/dL Final   RDW 11/10/2022 11.6  11.0 - 15.0 % Final   Platelets 11/10/2022 328  140 - 400 Thousand/uL Final   MPV 11/10/2022 9.9  7.5 - 12.5 fL Final   Neutro Abs 11/10/2022 3,906  1,500 - 7,800 cells/uL Final   Lymphs Abs 11/10/2022 1,896  850 - 3,900 cells/uL Final   Absolute Monocytes 11/10/2022 650  200 - 950 cells/uL Final   Eosinophils Absolute 11/10/2022 208  15 - 500 cells/uL Final   Basophils Absolute 11/10/2022 40  0 - 200 cells/uL Final   Neutrophils Relative % 11/10/2022 58.3  % Final   Total Lymphocyte 11/10/2022 28.3  % Final   Monocytes Relative 11/10/2022 9.7  % Final   Eosinophils Relative 11/10/2022 3.1  % Final   Basophils Relative 11/10/2022 0.6  % Final   Glucose, Bld 11/10/2022 109 (H)  65 - 99 mg/dL Final   Comment: .            Fasting reference interval . For someone without known diabetes, a glucose value between 100 and 125 mg/dL is consistent with prediabetes and should be confirmed with a follow-up test. .    BUN 11/10/2022 21  7 - 25 mg/dL Final   Creat 11/10/2022 0.87  0.60 - 1.00 mg/dL Final    eGFR 11/10/2022 69  > OR = 60 mL/min/1.47m Final   BUN/Creatinine Ratio 11/10/2022 SEE NOTE:  6 - 22 (calc) Final   Comment:    Not Reported: BUN and Creatinine are within    reference range. .    Sodium 11/10/2022 139  135 - 146 mmol/L Final   Potassium 11/10/2022 4.3  3.5 - 5.3 mmol/L Final   Chloride 11/10/2022 103  98 - 110 mmol/L Final   CO2 11/10/2022 28  20 - 32 mmol/L Final   Calcium 11/10/2022 9.7  8.6 - 10.4 mg/dL Final   Total Protein 11/10/2022 6.9  6.1 - 8.1 g/dL Final   Albumin 11/10/2022 4.2  3.6 - 5.1 g/dL Final   Globulin 11/10/2022 2.7  1.9 - 3.7 g/dL (calc) Final   AG Ratio 11/10/2022 1.6  1.0 - 2.5 (calc) Final   Total Bilirubin 11/10/2022 0.5  0.2 - 1.2 mg/dL Final   Alkaline phosphatase (APISO) 11/10/2022 64  37 - 153 U/L Final   AST 11/10/2022 21  10 - 35 U/L Final   ALT 11/10/2022 14  6 - 29 U/L Final   Hgb  A1c MFr Bld 11/10/2022 6.4 (H)  <5.7 % of total Hgb Final   Comment: For someone without known diabetes, a hemoglobin  A1c value between 5.7% and 6.4% is consistent with prediabetes and should be confirmed with a  follow-up test. . For someone with known diabetes, a value <7% indicates that their diabetes is well controlled. A1c targets should be individualized based on duration of diabetes, age, comorbid conditions, and other considerations. . This assay result is consistent with an increased risk of diabetes. . Currently, no consensus exists regarding use of hemoglobin A1c for diagnosis of diabetes for children. .    Mean Plasma Glucose 11/10/2022 137  mg/dL Final   eAG (mmol/L) 11/10/2022 7.6  mmol/L Final   Cholesterol 11/10/2022 150  <200 mg/dL Final   HDL 11/10/2022 58  > OR = 50 mg/dL Final   Triglycerides 11/10/2022 69  <150 mg/dL Final   LDL Cholesterol (Calc) 11/10/2022 77  mg/dL (calc) Final   Comment: Reference range: <100 . Desirable range <100 mg/dL for primary prevention;   <70 mg/dL for patients with CHD or diabetic patients   with > or = 2 CHD risk factors. Marland Kitchen LDL-C is now calculated using the Martin-Hopkins  calculation, which is a validated novel method providing  better accuracy than the Friedewald equation in the  estimation of LDL-C.  Cresenciano Genre et al. Annamaria Helling. 8182;993(71): 2061-2068  (http://education.[facility].com/faq/FAQ164)    Total CHOL/HDL Ratio 11/10/2022 2.6  <5.0 (calc) Final   Non-HDL Cholesterol (Calc) 11/10/2022 92  <130 mg/dL (calc) Final   Comment: For patients with diabetes plus 1 major ASCVD risk  factor, treating to a non-HDL-C goal of <100 mg/dL  (LDL-C of <70 mg/dL) is considered a therapeutic  option.    Vitamin B-12 11/10/2022 847  200 - 1,100 pg/mL Final   TSH 11/10/2022 1.10  0.40 - 4.50 mIU/L Final   Creatinine, Urine 11/10/2022 174  20 - 275 mg/dL Final   Microalb, Ur 11/10/2022 1.9  mg/dL Final   Comment: Reference Range Not established    Microalb Creat Ratio 11/10/2022 11  <30 mcg/mg creat Final   Comment: . The ADA defines abnormalities in albumin excretion as follows: Marland Kitchen Albuminuria Category        Result (mcg/mg creatinine) . Normal to Mildly increased   <30 Moderately increased         30-299  Severely increased           > OR = 300 . The ADA recommends that at least two of three specimens collected within a 3-6 month period be abnormal before considering a patient to be within a diagnostic category.    Thyroid test is excellent.  Patient is prediabetic with an A1c of 6.4 however her cholesterol has improved dramatically.  She denies any myalgias or right upper quadrant pain on her statin.  She does have some mild cramps in her legs Past Medical History:  Diagnosis Date   Bulging disc    Hypertension    Osteoporosis    Rosacea    Thyroid disease    Past Surgical History:  Procedure Laterality Date   BILATERAL TEMPOROMANDIBULAR JOINT ARTHROPLASTY     TUBAL LIGATION     Current Outpatient Medications on File Prior to Visit  Medication Sig  Dispense Refill   Ascorbic Acid (VITAMIN C) 100 MG tablet Take 100 mg daily by mouth.     aspirin 81 MG tablet Take 81 mg by mouth daily.     benazepril (LOTENSIN)  20 MG tablet Take 1 tablet (20 mg total) by mouth daily. 90 tablet 3   BIOTIN PO Take 1 tablet by mouth daily.     calcium carbonate (OS-CAL) 600 MG TABS Take 600 mg by mouth daily.     Cholecalciferol (VITAMIN D) 2000 UNITS tablet Take 2,000 Units by mouth daily.     Glucosamine-Chondroitin (COSAMIN DS PO) Take by mouth daily. Take 1 pill on odd days and 2 pills on even days     levothyroxine (SYNTHROID) 75 MCG tablet Take 1 tablet (75 mcg total) by mouth daily. 90 tablet 3   loratadine (CLARITIN) 10 MG tablet      PFIZER COVID-19 VAC BIVALENT injection      rosuvastatin (CRESTOR) 10 MG tablet TAKE 1 TABLET BY MOUTH EVERY DAY 90 tablet 0   Sodium Chloride-Xylitol (XLEAR SINUS CARE SPRAY NA) Place into the nose.     vancomycin (VANCOCIN) 125 MG capsule Take 1 capsule (125 mg total) by mouth 4 (four) times daily. 56 capsule 0   No current facility-administered medications on file prior to visit.   Allergies  Allergen Reactions   Other Diarrhea   Social History   Socioeconomic History   Marital status: Married    Spouse name: Not on file   Number of children: Not on file   Years of education: Not on file   Highest education level: Not on file  Occupational History   Not on file  Tobacco Use   Smoking status: Former    Types: Cigarettes    Quit date: 02/24/1992    Years since quitting: 30.7   Smokeless tobacco: Never  Substance and Sexual Activity   Alcohol use: No   Drug use: No   Sexual activity: Not on file  Other Topics Concern   Not on file  Social History Narrative   Not on file   Social Determinants of Health   Financial Resource Strain: Not on file  Food Insecurity: Not on file  Transportation Needs: Not on file  Physical Activity: Not on file  Stress: Not on file  Social Connections: Not on file   Intimate Partner Violence: Not on file      Review of Systems  Gastrointestinal:  Positive for diarrhea.  All other systems reviewed and are negative.      Objective:   Physical Exam Vitals reviewed.  Cardiovascular:     Rate and Rhythm: Normal rate and regular rhythm.     Heart sounds: Normal heart sounds.  Pulmonary:     Effort: Pulmonary effort is normal. No respiratory distress.     Breath sounds: Normal breath sounds. No wheezing or rales.  Abdominal:     General: Abdomen is flat. Bowel sounds are normal. There is no distension.     Palpations: Abdomen is soft. There is no mass.     Tenderness: There is no abdominal tenderness. There is no right CVA tenderness, left CVA tenderness, guarding or rebound.     Hernia: No hernia is present.  Musculoskeletal:     Cervical back: Neck supple.  Lymphadenopathy:     Cervical: No cervical adenopathy.           Assessment & Plan:  Essential hypertension  Hypothyroidism, unspecified type  Prediabetes  Pure hypercholesterolemia Blood pressure and cholesterol are outstanding.  Make no changes in her medication at this time.  Recheck blood sugar in 6 months.  Decrease intake of carbohydrates if possible.  TSH is excellent.  No change in  levothyroxine.  Recommended COVID booster

## 2022-12-02 ENCOUNTER — Ambulatory Visit (INDEPENDENT_AMBULATORY_CARE_PROVIDER_SITE_OTHER): Payer: Medicare HMO | Admitting: Family Medicine

## 2022-12-02 VITALS — BP 120/72 | HR 91 | Ht 66.0 in | Wt 147.0 lb

## 2022-12-02 DIAGNOSIS — J069 Acute upper respiratory infection, unspecified: Secondary | ICD-10-CM

## 2022-12-02 MED ORDER — AZITHROMYCIN 250 MG PO TABS: ORAL_TABLET | ORAL | 0 refills | Status: DC

## 2022-12-02 MED ORDER — HYDROCODONE BIT-HOMATROP MBR 5-1.5 MG/5ML PO SOLN: 5.0000 mL | Freq: Three times a day (TID) | ORAL | 0 refills | Status: DC | PRN

## 2022-12-02 NOTE — Progress Notes (Signed)
Subjective:    Patient ID: Marilyn [name], female    DOB: 1946/10/01, 76 y.o.   MRN: [id number]  Patient presents today with a cough.  Symptoms began on Friday.  Cough is nonproductive.  She does have some rhinorrhea.  She denies any fevers or chills.  She denies any purulent sputum.  She denies any sinus pain or sore throat or otalgia.  She denies any chest pain or shortness of breath or wheezing.  She is coughing frequently today but her lungs are completely clear to auscultation Past Medical History:  Diagnosis Date   Bulging disc    Hypertension    Osteoporosis    Rosacea    Thyroid disease    Past Surgical History:  Procedure Laterality Date   BILATERAL TEMPOROMANDIBULAR JOINT ARTHROPLASTY     TUBAL LIGATION     Current Outpatient Medications on File Prior to Visit  Medication Sig Dispense Refill   Ascorbic Acid (VITAMIN C) 100 MG tablet Take 100 mg daily by mouth.     aspirin 81 MG tablet Take 81 mg by mouth daily.     benazepril (LOTENSIN) 20 MG tablet Take 1 tablet (20 mg total) by mouth daily. 90 tablet 3   BIOTIN PO Take 1 tablet by mouth daily.     calcium carbonate (OS-CAL) 600 MG TABS Take 600 mg by mouth daily.     Cholecalciferol (VITAMIN D) 2000 UNITS tablet Take 2,000 Units by mouth daily.     Glucosamine-Chondroitin (COSAMIN DS PO) Take by mouth daily. Take 1 pill on odd days and 2 pills on even days     levothyroxine (SYNTHROID) 75 MCG tablet Take 1 tablet (75 mcg total) by mouth daily. 90 tablet 3   loratadine (CLARITIN) 10 MG tablet      PFIZER COVID-19 VAC BIVALENT injection      rosuvastatin (CRESTOR) 10 MG tablet TAKE 1 TABLET BY MOUTH EVERY DAY 90 tablet 0   Sodium Chloride-Xylitol (XLEAR SINUS CARE SPRAY NA) Place into the nose.     No current facility-administered medications on file prior to visit.   Allergies  Allergen Reactions   Other Diarrhea   Social History   Socioeconomic History   Marital status: Married    Spouse name: Not on file    Number of children: Not on file   Years of education: Not on file   Highest education level: Not on file  Occupational History   Not on file  Tobacco Use   Smoking status: Former    Types: Cigarettes    Quit date: 02/24/1992    Years since quitting: 30.7   Smokeless tobacco: Never  Substance and Sexual Activity   Alcohol use: No   Drug use: No   Sexual activity: Not on file  Other Topics Concern   Not on file  Social History Narrative   Not on file   Social Determinants of Health   Financial Resource Strain: Not on file  Food Insecurity: Not on file  Transportation Needs: Not on file  Physical Activity: Not on file  Stress: Not on file  Social Connections: Not on file  Intimate Partner Violence: Not on file      Review of Systems  Gastrointestinal:  Positive for diarrhea.  All other systems reviewed and are negative.      Objective:   Physical Exam Vitals reviewed.  Constitutional:      General: She is not in acute distress.    Appearance: Normal appearance. She  is normal weight. She is not ill-appearing or toxic-appearing.  HENT:     Right Ear: Tympanic membrane and ear canal normal.     Left Ear: Tympanic membrane and ear canal normal.     Nose: Congestion present. No rhinorrhea.     Mouth/Throat:     Pharynx: Oropharynx is clear. No oropharyngeal exudate or posterior oropharyngeal erythema.  Eyes:     Conjunctiva/sclera: Conjunctivae normal.  Cardiovascular:     Rate and Rhythm: Normal rate and regular rhythm.     Heart sounds: Normal heart sounds.  Pulmonary:     Effort: Pulmonary effort is normal. No respiratory distress.     Breath sounds: Normal breath sounds. No stridor. No wheezing, rhonchi or rales.  Musculoskeletal:     Cervical back: Neck supple.  Lymphadenopathy:     Cervical: No cervical adenopathy.           Assessment & Plan:  Viral upper respiratory tract infection Patient has a viral upper respiratory infection.  Recommended  symptomatic relief with Hycodan 1 teaspoon every 6 hours as needed for cough and tincture of time.  I did ask the patient to go home and perform a rapid COVID test and if positive I will be glad to call out Paxlovid.  I gave her an antibiotic with instructions not to fill unless she develops purulent sputum or high fever or shortness of breath.  I explained to the patient I feel this is most likely viral and it must run its course.

## 2023-02-13 ENCOUNTER — Other Ambulatory Visit: Payer: Self-pay | Admitting: Family Medicine

## 2023-02-13 NOTE — Telephone Encounter (Signed)
Requested Prescriptions  Pending Prescriptions Disp Refills   rosuvastatin (CRESTOR) 10 MG tablet [Pharmacy Med Name: ROSUVASTATIN CALCIUM 10 MG TAB] 90 tablet 0    Sig: TAKE 1 TABLET BY MOUTH EVERY DAY     Cardiovascular:  Antilipid - Statins 2 Failed - 02/13/2023  1:37 AM      Failed - Valid encounter within last 12 months    Recent Outpatient Visits           9 months ago General medical exam   [place] Susy Frizzle, MD   2 years ago General medical exam   [place] Susy Frizzle, MD   2 years ago Diarrhea of presumed infectious origin   [place] Pickard, Cammie Mcgee, MD   2 years ago Recurrent sinus infections   [place], [place] Nunnery, MD   5 years ago Routine general medical examination at a health care facility   [place], Cammie Mcgee, MD              Failed - Lipid Panel in normal range within the last 12 months    Cholesterol  Date Value Ref Range Status  11/10/2022 150 <200 mg/dL Final   LDL Cholesterol (Calc)  Date Value Ref Range Status  11/10/2022 77 mg/dL (calc) Final    Comment:    Reference range: <100 . Desirable range <100 mg/dL for primary prevention;   <70 mg/dL for patients with CHD or diabetic patients  with > or = 2 CHD risk factors. Marland Kitchen LDL-C is now calculated using the Martin-Hopkins  calculation, which is a validated novel method providing  better accuracy than the Friedewald equation in the  estimation of LDL-C.  Cresenciano Genre et al. Annamaria Helling. MU:[id number]): 2061-2068  (http://education.[facility].com/faq/FAQ164)    HDL  Date Value Ref Range Status  11/10/2022 58 > OR = 50 mg/dL Final   Triglycerides  Date Value Ref Range Status  11/10/2022 69 <150 mg/dL Final         Passed - Cr in normal range and within 360 days    Creat  Date Value Ref Range Status  11/10/2022 0.87 0.60 - 1.00 mg/dL Final   Creatinine, Urine  Date  Value Ref Range Status  11/10/2022 174 20 - 275 mg/dL Final         Passed - Patient is not pregnant

## 2023-03-09 DIAGNOSIS — M810 Age-related osteoporosis without current pathological fracture: Secondary | ICD-10-CM | POA: Insufficient documentation

## 2023-03-09 DIAGNOSIS — Z01419 Encounter for gynecological examination (general) (routine) without abnormal findings: Secondary | ICD-10-CM | POA: Diagnosis not present

## 2023-03-09 DIAGNOSIS — Z6823 Body mass index (BMI) 23.0-23.9, adult: Secondary | ICD-10-CM | POA: Diagnosis not present

## 2023-03-09 DIAGNOSIS — Z1231 Encounter for screening mammogram for malignant neoplasm of breast: Secondary | ICD-10-CM | POA: Diagnosis not present

## 2023-03-09 LAB — HM MAMMOGRAPHY

## 2023-03-17 ENCOUNTER — Ambulatory Visit (INDEPENDENT_AMBULATORY_CARE_PROVIDER_SITE_OTHER): Payer: Medicare HMO | Admitting: Family Medicine

## 2023-03-17 ENCOUNTER — Encounter: Payer: Self-pay | Admitting: Family Medicine

## 2023-03-17 VITALS — BP 120/70 | HR 73 | Temp 97.9°F | Ht 66.0 in | Wt 146.0 lb

## 2023-03-17 DIAGNOSIS — J069 Acute upper respiratory infection, unspecified: Secondary | ICD-10-CM

## 2023-03-17 MED ORDER — AZITHROMYCIN 250 MG PO TABS: ORAL_TABLET | ORAL | 0 refills | Status: DC

## 2023-03-17 MED ORDER — LEVOCETIRIZINE DIHYDROCHLORIDE 5 MG PO TABS: 5.0000 mg | ORAL_TABLET | Freq: Every evening | ORAL | 0 refills | Status: DC

## 2023-03-17 MED ORDER — HYDROCODONE BIT-HOMATROP MBR 5-1.5 MG/5ML PO SOLN: 5.0000 mL | Freq: Three times a day (TID) | ORAL | 0 refills | Status: DC | PRN

## 2023-03-17 NOTE — Progress Notes (Signed)
Subjective:    Patient ID: Marilyn [name], female    DOB: [birth year], 77 y.o.   MRN: [id number]  Last week, the patient's daughter and granddaughter were sick with an upper respiratory infection.  Patient presents today with 2 days of head congestion, rhinorrhea, cough productive of yellow sputum.  She denies any sinus pain.  She denies any headache other than when she coughs.  She denies any sore throat or otalgia.  She denies any fevers or chills.  She denies any chest pain or pleurisy.  She denies any purulent sputum.  She denies any shortness of breath. Past Medical History:  Diagnosis Date   Bulging disc    Hypertension    Osteoporosis    Rosacea    Thyroid disease    Past Surgical History:  Procedure Laterality Date   BILATERAL TEMPOROMANDIBULAR JOINT ARTHROPLASTY     TUBAL LIGATION     Current Outpatient Medications on File Prior to Visit  Medication Sig Dispense Refill   Ascorbic Acid (VITAMIN C) 100 MG tablet Take 100 mg daily by mouth.     aspirin 81 MG tablet Take 81 mg by mouth daily.     azithromycin (ZITHROMAX) 250 MG tablet 2 tabs poqday1, 1 tab poqday 2-5 6 tablet 0   benazepril (LOTENSIN) 20 MG tablet Take 1 tablet (20 mg total) by mouth daily. 90 tablet 3   BIOTIN PO Take 1 tablet by mouth daily.     calcium carbonate (OS-CAL) 600 MG TABS Take 600 mg by mouth daily.     Cholecalciferol (VITAMIN D) 2000 UNITS tablet Take 2,000 Units by mouth daily.     Glucosamine-Chondroitin (COSAMIN DS PO) Take by mouth daily. Take 1 pill on odd days and 2 pills on even days     HYDROcodone bit-homatropine (HYCODAN) 5-1.5 MG/5ML syrup Take 5 mLs by mouth every 8 (eight) hours as needed for cough. 120 mL 0   levothyroxine (SYNTHROID) 75 MCG tablet Take 1 tablet (75 mcg total) by mouth daily. 90 tablet 3   loratadine (CLARITIN) 10 MG tablet      PFIZER COVID-19 VAC BIVALENT injection      rosuvastatin (CRESTOR) 10 MG tablet TAKE 1 TABLET BY MOUTH EVERY DAY 90 tablet 0   Sodium  Chloride-Xylitol (XLEAR SINUS CARE SPRAY NA) Place into the nose.     No current facility-administered medications on file prior to visit.   Allergies  Allergen Reactions   Other Diarrhea   Social History   Socioeconomic History   Marital status: Married    Spouse name: Not on file   Number of children: Not on file   Years of education: Not on file   Highest education level: Not on file  Occupational History   Not on file  Tobacco Use   Smoking status: Former    Types: Cigarettes    Quit date: 02/24/1992    Years since quitting: 31.0   Smokeless tobacco: Never  Substance and Sexual Activity   Alcohol use: No   Drug use: No   Sexual activity: Not on file  Other Topics Concern   Not on file  Social History Narrative   Not on file   Social Determinants of Health   Financial Resource Strain: Not on file  Food Insecurity: Not on file  Transportation Needs: Not on file  Physical Activity: Not on file  Stress: Not on file  Social Connections: Not on file  Intimate Partner Violence: Not on file  Review of Systems  All other systems reviewed and are negative.      Objective:   Physical Exam Vitals reviewed.  Constitutional:      General: She is not in acute distress.    Appearance: Normal appearance. She is normal weight. She is not ill-appearing or toxic-appearing.  HENT:     Right Ear: Tympanic membrane and ear canal normal.     Left Ear: Tympanic membrane and ear canal normal.     Nose: Congestion and rhinorrhea present.     Mouth/Throat:     Pharynx: Oropharynx is clear. No oropharyngeal exudate or posterior oropharyngeal erythema.  Eyes:     Conjunctiva/sclera: Conjunctivae normal.  Cardiovascular:     Rate and Rhythm: Normal rate and regular rhythm.     Heart sounds: Normal heart sounds.  Pulmonary:     Effort: Pulmonary effort is normal. No respiratory distress.     Breath sounds: Normal breath sounds. No stridor. No wheezing, rhonchi or rales.   Musculoskeletal:     Cervical back: Neck supple.  Lymphadenopathy:     Cervical: No cervical adenopathy.           Assessment & Plan:  Viral upper respiratory tract infection Symptoms are consistent with a viral upper respiratory infection.  Recommended treating the patient symptomatically.  I will give her Xyzal 5 mg daily for drainage and congestion.  She can also try over-the-counter Coricidin HBP.  She can use Hycodan 1 teaspoon every 8 hours as needed for cough.  Recommended tincture of time.  Anticipate gradual improvement over 5 days.  If worsening, such as high fever, shortness of breath, purulent sputum, did give patient a Z-Pak with directions when to fill as she is going to be out of town on vacation in 2 days

## 2023-03-30 DIAGNOSIS — H5203 Hypermetropia, bilateral: Secondary | ICD-10-CM | POA: Diagnosis not present

## 2023-04-06 DIAGNOSIS — Z0100 Encounter for examination of eyes and vision without abnormal findings: Secondary | ICD-10-CM | POA: Diagnosis not present

## 2023-04-13 ENCOUNTER — Other Ambulatory Visit: Payer: Self-pay | Admitting: Family Medicine

## 2023-04-14 NOTE — Telephone Encounter (Signed)
Requested Prescriptions  Pending Prescriptions Disp Refills   levocetirizine (XYZAL) 5 MG tablet [Pharmacy Med Name: LEVOCETIRIZINE 5 MG TABLET] 90 tablet 3    Sig: TAKE 1 TABLET BY MOUTH EVERY DAY IN THE EVENING     Ear, Nose, and Throat:  Antihistamines - levocetirizine dihydrochloride Failed - 04/13/2023  2:25 AM      Failed - Valid encounter within last 12 months    Recent Outpatient Visits           11 months ago General medical exam   [facility] Donita Brooks, MD   2 years ago General medical exam   [facility] Donita Brooks, MD   2 years ago Diarrhea of presumed infectious origin   [facility] Pickard, Priscille Heidelberg, MD   2 years ago Recurrent sinus infections   [facility] [place], Velna Hatchet, MD   5 years ago Routine general medical examination at a health care facility   [facility] Pickard, Priscille Heidelberg, MD              Passed - Cr in normal range and within 360 days    Creat  Date Value Ref Range Status  11/10/2022 0.87 0.60 - 1.00 mg/dL Final   Creatinine, Urine  Date Value Ref Range Status  11/10/2022 174 20 - 275 mg/dL Final         Passed - eGFR is 10 or above and within 360 days    GFR, Est African American  Date Value Ref Range Status  11/03/2017 63 > OR = 60 mL/min/1.46m2 Final   GFR, Est Non African American  Date Value Ref Range Status  11/03/2017 54 (L) > OR = 60 mL/min/1.33m2 Final   eGFR  Date Value Ref Range Status  11/10/2022 69 > OR = 60 mL/min/1.53m2 Final

## 2023-04-17 ENCOUNTER — Telehealth: Payer: Self-pay | Admitting: Family Medicine

## 2023-04-17 NOTE — Telephone Encounter (Signed)
Contacted Marilyn [name] to schedule their annual wellness visit. Appointment made for 04/23/2023.  Thank you,  Judeth Cornfield,  AMB Clinical Support [facility] AWV Program Direct Dial ??[phone]

## 2023-04-21 ENCOUNTER — Other Ambulatory Visit: Payer: Self-pay

## 2023-04-21 ENCOUNTER — Telehealth: Payer: Self-pay

## 2023-04-21 DIAGNOSIS — I10 Essential (primary) hypertension: Secondary | ICD-10-CM

## 2023-04-21 MED ORDER — BENAZEPRIL HCL 20 MG PO TABS
20.0000 mg | ORAL_TABLET | Freq: Every day | ORAL | 1 refills | Status: DC
Start: 2023-04-21 — End: 2023-05-12

## 2023-04-21 NOTE — Telephone Encounter (Signed)
Pt requests a new prescription   Prescription Request  04/21/2023  LOV: 03/17/23  What is the name of the medication or equipment? benazepril (LOTENSIN) 20 MG tablet [[id number]]  Have you contacted your pharmacy to request a refill? Yes   Which pharmacy would you like this sent to?  [facility] - [place], [place] - [address] Portal to Registered [facility] [address] [place] [address] Phone: [phone] Fax: [phone]    Patient notified that their request is being sent to the clinical staff for review and that they should receive a response within 2 business days.   Please advise at [facility] [phone]

## 2023-04-23 ENCOUNTER — Ambulatory Visit (INDEPENDENT_AMBULATORY_CARE_PROVIDER_SITE_OTHER): Payer: Medicare HMO

## 2023-04-23 ENCOUNTER — Telehealth: Payer: Self-pay | Admitting: Family Medicine

## 2023-04-23 ENCOUNTER — Other Ambulatory Visit: Payer: Self-pay

## 2023-04-23 ENCOUNTER — Other Ambulatory Visit: Payer: Self-pay | Admitting: Family Medicine

## 2023-04-23 VITALS — Ht 66.0 in | Wt 146.0 lb

## 2023-04-23 DIAGNOSIS — E039 Hypothyroidism, unspecified: Secondary | ICD-10-CM

## 2023-04-23 DIAGNOSIS — Z0000 Encounter for general adult medical examination without abnormal findings: Secondary | ICD-10-CM

## 2023-04-23 MED ORDER — LEVOTHYROXINE SODIUM 75 MCG PO TABS: 75.0000 ug | ORAL_TABLET | Freq: Every day | ORAL | 1 refills | Status: DC

## 2023-04-23 MED ORDER — LEVOTHYROXINE SODIUM 75 MCG PO TABS
75.0000 ug | ORAL_TABLET | Freq: Every day | ORAL | 1 refills | Status: DC
Start: 2023-04-23 — End: 2023-04-23

## 2023-04-23 NOTE — Patient Instructions (Addendum)
Marilyn [name] , Thank you for taking time to come for your Medicare Wellness Visit. I appreciate your ongoing commitment to your health goals. Please review the following plan we discussed and let me know if I can assist you in the future.   These are the goals we discussed:  Goals      Remain active and independent        This is a list of the screening recommended for you and due dates:  Health Maintenance  Topic Date Due   Zoster (Shingles) Vaccine (1 of 2) Never done   COVID-19 Vaccine (3 - 2023-24 season) 08/29/2022   Flu Shot  07/30/2023   Mammogram  03/04/2024   Medicare Annual Wellness Visit  04/22/2024   Pneumonia Vaccine  Completed   DEXA scan (bone density measurement)  Completed   Hepatitis C Screening: USPSTF Recommendation to screen - Ages 18-79 yo.  Completed   HPV Vaccine  Aged Out   DTaP/Tdap/Td vaccine  Discontinued   Colon Cancer Screening  Discontinued    Advanced directives: Forms are available if you choose in the future to pursue completion.  This is recommended in order to make sure that your health wishes are honored in the event that you are unable to verbalize them to the provider.    Conditions/risks identified: Aim for 30 minutes of exercise or brisk walking, 6-8 glasses of water, and 5 servings of fruits and vegetables each day.   Next appointment: Follow up in one year for your annual wellness visit    Preventive Care 65 Years and Older, Female Preventive care refers to lifestyle choices and visits with your health care provider that can promote health and wellness. What does preventive care include? A yearly physical exam. This is also called an annual well check. Dental exams once or twice a year. Routine eye exams. Ask your health care provider how often you should have your eyes checked. Personal lifestyle choices, including: Daily care of your teeth and gums. Regular physical activity. Eating a healthy diet. Avoiding tobacco and drug  use. Limiting alcohol use. Practicing safe sex. Taking low-dose aspirin every day. Taking vitamin and mineral supplements as recommended by your health care provider. What happens during an annual well check? The services and screenings done by your health care provider during your annual well check will depend on your age, overall health, lifestyle risk factors, and family history of disease. Counseling  Your health care provider may ask you questions about your: Alcohol use. Tobacco use. Drug use. Emotional well-being. Home and relationship well-being. Sexual activity. Eating habits. History of falls. Memory and ability to understand (cognition). Work and work Astronomer. Reproductive health. Screening  You may have the following tests or measurements: Height, weight, and BMI. Blood pressure. Lipid and cholesterol levels. These may be checked every 5 years, or more frequently if you are over 56 years old. Skin check. Lung cancer screening. You may have this screening every year starting at age 41 if you have a 30-pack-year history of smoking and currently smoke or have quit within the past 15 years. Fecal occult blood test (FOBT) of the stool. You may have this test every year starting at age 42. Flexible sigmoidoscopy or colonoscopy. You may have a sigmoidoscopy every 5 years or a colonoscopy every 10 years starting at age 86. Hepatitis C blood test. Hepatitis B blood test. Sexually transmitted disease (STD) testing. Diabetes screening. This is done by checking your blood sugar (glucose) after you have not  eaten for a while (fasting). You may have this done every 1-3 years. Bone density scan. This is done to screen for osteoporosis. You may have this done starting at age 23. Mammogram. This may be done every 1-2 years. Talk to your health care provider about how often you should have regular mammograms. Talk with your health care provider about your test results, treatment  options, and if necessary, the need for more tests. Vaccines  Your health care provider may recommend certain vaccines, such as: Influenza vaccine. This is recommended every year. Tetanus, diphtheria, and acellular pertussis (Tdap, Td) vaccine. You may need a Td booster every 10 years. Zoster vaccine. You may need this after age 62. Pneumococcal 13-valent conjugate (PCV13) vaccine. One dose is recommended after age 85. Pneumococcal polysaccharide (PPSV23) vaccine. One dose is recommended after age 17. Talk to your health care provider about which screenings and vaccines you need and how often you need them. This information is not intended to replace advice given to you by your health care provider. Make sure you discuss any questions you have with your health care provider. Document Released: 01/11/2016 Document Revised: 09/03/2016 Document Reviewed: 10/16/2015 Elsevier Interactive Patient Education  2017 [employer] [employer] Prevention in the Home Falls can cause injuries. They can happen to people of all ages. There are many things you can do to make your home safe and to help prevent falls. What can I do on the outside of my home? Regularly fix the edges of walkways and driveways and fix any cracks. Remove anything that might make you trip as you walk through a door, such as a raised step or threshold. Trim any bushes or trees on the path to your home. Use bright outdoor lighting. Clear any walking paths of anything that might make someone trip, such as rocks or tools. Regularly check to see if handrails are loose or broken. Make sure that both sides of any steps have handrails. Any raised decks and porches should have guardrails on the edges. Have any leaves, snow, or ice cleared regularly. Use sand or salt on walking paths during winter. Clean up any spills in your garage right away. This includes oil or grease spills. What can I do in the bathroom? Use night lights. Install grab  bars by the toilet and in the tub and shower. Do not use towel bars as grab bars. Use non-skid mats or decals in the tub or shower. If you need to sit down in the shower, use a plastic, non-slip stool. Keep the floor dry. Clean up any water that spills on the floor as soon as it happens. Remove soap buildup in the tub or shower regularly. Attach bath mats securely with double-sided non-slip rug tape. Do not have throw rugs and other things on the floor that can make you trip. What can I do in the bedroom? Use night lights. Make sure that you have a light by your bed that is easy to reach. Do not use any sheets or blankets that are too big for your bed. They should not hang down onto the floor. Have a firm chair that has side arms. You can use this for support while you get dressed. Do not have throw rugs and other things on the floor that can make you trip. What can I do in the kitchen? Clean up any spills right away. Avoid walking on wet floors. Keep items that you use a lot in easy-to-reach places. If you need to reach  something above you, use a strong step stool that has a grab bar. Keep electrical cords out of the way. Do not use floor polish or wax that makes floors slippery. If you must use wax, use non-skid floor wax. Do not have throw rugs and other things on the floor that can make you trip. What can I do with my stairs? Do not leave any items on the stairs. Make sure that there are handrails on both sides of the stairs and use them. Fix handrails that are broken or loose. Make sure that handrails are as long as the stairways. Check any carpeting to make sure that it is firmly attached to the stairs. Fix any carpet that is loose or worn. Avoid having throw rugs at the top or bottom of the stairs. If you do have throw rugs, attach them to the floor with carpet tape. Make sure that you have a light switch at the top of the stairs and the bottom of the stairs. If you do not have them,  ask someone to add them for you. What else can I do to help prevent falls? Wear shoes that: Do not have high heels. Have rubber bottoms. Are comfortable and fit you well. Are closed at the toe. Do not wear sandals. If you use a stepladder: Make sure that it is fully opened. Do not climb a closed stepladder. Make sure that both sides of the stepladder are locked into place. Ask someone to hold it for you, if possible. Clearly mark and make sure that you can see: Any grab bars or handrails. First and last steps. Where the edge of each step is. Use tools that help you move around (mobility aids) if they are needed. These include: Canes. Walkers. Scooters. Crutches. Turn on the lights when you go into a dark area. Replace any light bulbs as soon as they burn out. Set up your furniture so you have a clear path. Avoid moving your furniture around. If any of your floors are uneven, fix them. If there are any pets around you, be aware of where they are. Review your medicines with your doctor. Some medicines can make you feel dizzy. This can increase your chance of falling. Ask your doctor what other things that you can do to help prevent falls. This information is not intended to replace advice given to you by your health care provider. Make sure you discuss any questions you have with your health care provider. Document Released: 10/11/2009 Document Revised: 05/22/2016 Document Reviewed: 01/19/2015 Elsevier Interactive Patient Education  2017 [employer].

## 2023-04-23 NOTE — Progress Notes (Signed)
Subjective:   Marilyn [name] is a 77 y.o. female who presents for Medicare Annual (Subsequent) preventive examination.  I connected with  Marilyn [name] on 04/23/23 by a audio enabled telemedicine application and verified that I am speaking with the correct person using two identifiers.  Patient Location: Home  Provider Location: Home Office  I discussed the limitations of evaluation and management by telemedicine. The patient expressed understanding and agreed to proceed.  Review of Systems     Cardiac Risk Factors include: advanced age (>59men, >29 women);hypertension     Objective:    Today's Vitals   04/23/23 1505  Weight: 146 lb (66.2 kg)  Height:  (1.676 m)   Body mass index is 23.57 kg/m.     04/23/2023    4:15 PM  Advanced Directives  Does Patient Have a Medical Advance Directive? No  Would patient like information on creating a medical advance directive? No - Patient declined    Current Medications (verified) Outpatient Encounter Medications as of 04/23/2023  Medication Sig   Ascorbic Acid (VITAMIN C) 100 MG tablet Take 100 mg daily by mouth.   aspirin 81 MG tablet Take 81 mg by mouth daily.   benazepril (LOTENSIN) 20 MG tablet Take 1 tablet (20 mg total) by mouth daily.   BIOTIN PO Take 1 tablet by mouth daily.   calcium carbonate (OS-CAL) 600 MG TABS Take 600 mg by mouth daily.   Cholecalciferol (VITAMIN D) 2000 UNITS tablet Take 2,000 Units by mouth daily.   Glucosamine-Chondroitin (COSAMIN DS PO) Take by mouth daily. Take 1 pill on odd days and 2 pills on even days   levocetirizine (XYZAL) 5 MG tablet TAKE 1 TABLET BY MOUTH EVERY DAY IN THE EVENING   levothyroxine (SYNTHROID) 75 MCG tablet Take 1 tablet (75 mcg total) by mouth daily.   PFIZER COVID-19 VAC BIVALENT injection    rosuvastatin (CRESTOR) 10 MG tablet TAKE 1 TABLET BY MOUTH EVERY DAY   Sodium Chloride-Xylitol (XLEAR SINUS CARE SPRAY NA) Place into the nose.   [DISCONTINUED] HYDROcodone  bit-homatropine (HYCODAN) 5-1.5 MG/5ML syrup Take 5 mLs by mouth every 8 (eight) hours as needed for cough.   [DISCONTINUED] levothyroxine (SYNTHROID) 75 MCG tablet Take 1 tablet (75 mcg total) by mouth daily.   No facility-administered encounter medications on file as of 04/23/2023.    Allergies (verified) Other   History: Past Medical History:  Diagnosis Date   Bulging disc    Hypertension    Osteoporosis    Rosacea    Thyroid disease    Past Surgical History:  Procedure Laterality Date   BILATERAL TEMPOROMANDIBULAR JOINT ARTHROPLASTY     TUBAL LIGATION     Family History  Problem Relation Age of Onset   Colon cancer Neg Hx    Social History   Socioeconomic History   Marital status: Married    Spouse name: Not on file   Number of children: Not on file   Years of education: Not on file   Highest education level: Not on file  Occupational History   Not on file  Tobacco Use   Smoking status: Former    Types: Cigarettes    Quit date: 02/24/1992    Years since quitting: 31.1   Smokeless tobacco: Never  Substance and Sexual Activity   Alcohol use: No   Drug use: No   Sexual activity: Not on file  Other Topics Concern   Not on file  Social History Narrative  Not on file   Social Determinants of Health   Financial Resource Strain: Low Risk  (04/23/2023)   Overall Financial Resource Strain (CARDIA)    Difficulty of Paying Living Expenses: Not hard at all  Food Insecurity: No Food Insecurity (04/23/2023)   Hunger Vital Sign    Worried About Running Out of Food in the Last Year: Never true    Ran Out of Food in the Last Year: Never true  Transportation Needs: No Transportation Needs (04/23/2023)   PRAPARE - Administrator, Civil Service (Medical): No    Lack of Transportation (Non-Medical): No  Physical Activity: Insufficiently Active (04/23/2023)   Exercise Vital Sign    Days of Exercise per Week: 3 days    Minutes of Exercise per Session: 30 min   Stress: No Stress Concern Present (04/23/2023)   [employer] of Occupational Health - Occupational Stress Questionnaire    Feeling of Stress : Not at all  Social Connections: Socially Integrated (04/23/2023)   Social Connection and Isolation Panel [NHANES]    Frequency of Communication with Friends and Family: More than three times a week    Frequency of Social Gatherings with Friends and Family: Three times a week    Attends Religious Services: More than 4 times per year    Active Member of Clubs or Organizations: Yes    Attends Engineer, structural: More than 4 times per year    Marital Status: Married    Tobacco Counseling Counseling given: Not Answered   Clinical Intake:  Pre-visit preparation completed: Yes  Pain : No/denies pain  Diabetes: No  How often do you need to have someone help you when you read instructions, pamphlets, or other written materials from your doctor or pharmacy?: 1 - Never  Diabetic?No   Interpreter Needed?: No  Information entered by :: Kandis Fantasia LPN   Activities of Daily Living    04/23/2023    4:12 PM  In your present state of health, do you have any difficulty performing the following activities:  Hearing? 0  Vision? 0  Difficulty concentrating or making decisions? 0  Walking or climbing stairs? 0  Dressing or bathing? 0  Doing errands, shopping? 0  Preparing Food and eating ? N  Using the Toilet? N  In the past six months, have you accidently leaked urine? N  Do you have problems with loss of bowel control? N  Managing your Medications? N  Managing your Finances? N  Housekeeping or managing your Housekeeping? N    Patient Care Team: Donita Brooks, MD as PCP - General (Family Medicine) Freddy Finner, MD (Inactive) as Attending Physician (Obstetrics and Gynecology)  Indicate any recent Medical Services you may have received from other than [facility] providers in the past year (date may be approximate).      Assessment:   This is a routine wellness examination for [place].  Hearing/Vision screen Hearing Screening - Comments:: Denies hearing difficulties   Vision Screening - Comments:: Wears rx glasses - up to date with routine eye exams with Dr. Shea Evans    Dietary issues and exercise activities discussed: Current Exercise Habits: Home exercise routine, Type of exercise: walking, Time (Minutes): 30, Frequency (Times/Week): 3, Weekly Exercise (Minutes/Week): 90, Intensity: Mild   Goals Addressed             This Visit's Progress    Remain active and independent         Depression Screen    04/23/2023  4:06 PM 12/02/2022    2:28 PM 11/13/2022    2:10 PM 05/12/2022   10:24 AM 05/12/2022   10:20 AM 02/01/2021    2:54 PM 11/06/2017   11:15 AM  PHQ 2/9 Scores  PHQ - 2 Score 0 0 0 0 0 0 0  PHQ- 9 Score    5 0      Fall Risk    04/23/2023    4:12 PM 12/02/2022    2:28 PM 11/13/2022    2:10 PM 05/12/2022   10:20 AM 02/01/2021    2:54 PM  Fall Risk   Falls in the past year? 1 0 0 0 0  Number falls in past yr: 0 0 0 0 0  Injury with Fall? 1 0 0 0 0  Risk for fall due to : History of fall(s) No Fall Risks No Fall Risks    Follow up Falls evaluation completed;Falls prevention discussed;Education provided Falls prevention discussed Falls prevention discussed  Falls evaluation completed    FALL RISK PREVENTION PERTAINING TO THE HOME:  Any stairs in or around the home? No  If so, are there any without handrails? No  Home free of loose throw rugs in walkways, pet beds, electrical cords, etc? Yes  Adequate lighting in your home to reduce risk of falls? Yes   ASSISTIVE DEVICES UTILIZED TO PREVENT FALLS:  Life alert? No  Use of a cane, walker or w/c? No  Grab bars in the bathroom? Yes  Shower chair or bench in shower? No  Elevated toilet seat or a handicapped toilet? Yes   TIMED UP AND GO:  Was the test performed? No . Telephonic visit   Cognitive Function:        04/23/2023     4:14 PM  6CIT Screen  What Year? 0 points  What month? 0 points  What time? 0 points  Count back from 20 0 points  Months in reverse 0 points  Repeat phrase 0 points  Total Score 0 points    Immunizations Immunization History  Administered Date(s) Administered   Fluad Quad(high Dose 65+) 11/10/2022   Influenza, High Dose Seasonal PF 10/07/2017, 12/24/2020   Influenza,inj,Quad PF,6+ Mos 01/04/2014, 11/06/2015, 09/07/2018   Influenza-Unspecified 10/20/2016   PFIZER(Purple Top)SARS-COV-2 Vaccination 02/20/2020, 03/12/2020   Pneumococcal Conjugate-13 06/21/2015   Pneumococcal Polysaccharide-23 10/26/2012   Tdap 03/27/2011    TDAP status: Due, Education has been provided regarding the importance of this vaccine. Advised may receive this vaccine at local pharmacy or Health Dept. Aware to provide a copy of the vaccination record if obtained from local pharmacy or Health Dept. Verbalized acceptance and understanding.  Flu Vaccine status: Up to date  Pneumococcal vaccine status: Up to date  Covid-19 vaccine status: Information provided on how to obtain vaccines.   Qualifies for Shingles Vaccine? Yes   Zostavax completed No   Shingrix Completed?: No.    Education has been provided regarding the importance of this vaccine. Patient has been advised to call insurance company to determine out of pocket expense if they have not yet received this vaccine. Advised may also receive vaccine at local pharmacy or Health Dept. Verbalized acceptance and understanding.  Screening Tests Health Maintenance  Topic Date Due   Zoster Vaccines- Shingrix (1 of 2) Never done   COVID-19 Vaccine (3 - 2023-24 season) 08/29/2022   INFLUENZA VACCINE  07/30/2023   MAMMOGRAM  03/04/2024   Medicare Annual Wellness (AWV)  04/22/2024   Pneumonia Vaccine 34+ Years old  Completed  DEXA SCAN  Completed   Hepatitis C Screening  Completed   HPV VACCINES  Aged Out   DTaP/Tdap/Td  Discontinued   COLONOSCOPY (Pts  45-53yrs Insurance coverage will need to be confirmed)  Discontinued    Health Maintenance  Health Maintenance Due  Topic Date Due   Zoster Vaccines- Shingrix (1 of 2) Never done   COVID-19 Vaccine (3 - 2023-24 season) 08/29/2022    Colorectal cancer screening: No longer required.   Mammogram status: Completed 03/04/22. Repeat every year  Bone Density status: Completed 10/11/19. Results reflect: Bone density results: OSTEOPOROSIS. Repeat every 2 years.  Lung Cancer Screening: (Low Dose CT Chest recommended if Age 69-80 years, 30 pack-year currently smoking OR have quit w/in 15years.) does not qualify.   Lung Cancer Screening Referral: n/a  Additional Screening:  Hepatitis C Screening: does qualify; Completed 11/03/17  Vision Screening: Recommended annual ophthalmology exams for early detection of glaucoma and other disorders of the eye. Is the patient up to date with their annual eye exam?  Yes  Who is the provider or what is the name of the office in which the patient attends annual eye exams? Unable to provide name  If pt is not established with a provider, would they like to be referred to a provider to establish care? No .   Dental Screening: Recommended annual dental exams for proper oral hygiene  Community Resource Referral / Chronic Care Management: CRR required this visit?  No   CCM required this visit?  No      Plan:     I have personally reviewed and noted the following in the patient's chart:   Medical and social history Use of alcohol, tobacco or illicit drugs  Current medications and supplements including opioid prescriptions. Patient is not currently taking opioid prescriptions. Functional ability and status Nutritional status Physical activity Advanced directives List of other physicians Hospitalizations, surgeries, and ER visits in previous 12 months Vitals Screenings to include cognitive, depression, and falls Referrals and appointments  In  addition, I have reviewed and discussed with patient certain preventive protocols, quality metrics, and best practice recommendations. A written personalized care plan for preventive services as well as general preventive health recommendations were provided to patient.     Durwin Nora, [place]   8/65/7846   Due to this being a virtual visit, the after visit summary with patients personalized plan was offered to patient via mail or my-chart. Patient would like to access on my-chart  Nurse Notes: No concerns

## 2023-04-23 NOTE — Telephone Encounter (Signed)
Prescription Request  04/23/2023  LOV: 03/17/2023  What is the name of the medication or equipment?   levothyroxine (SYNTHROID) 75 MCG tablet  **PATIENT REQUESTED A NEW SCRIPT**  Have you contacted your pharmacy to request a refill? Yes   Which pharmacy would you like this sent to?  [facility] - [place], [place] - [address] [facility] AT Portal to Registered [facility] Sites [address] [place] [address] Phone: [phone] Fax: [phone]    Patient notified that their request is being sent to the clinical staff for review and that they should receive a response within 2 business days.   Please advise pharmacist at [phone].

## 2023-05-08 ENCOUNTER — Other Ambulatory Visit: Payer: Self-pay | Admitting: Family Medicine

## 2023-05-08 NOTE — Telephone Encounter (Signed)
Requested Prescriptions  Pending Prescriptions Disp Refills   rosuvastatin (CRESTOR) 10 MG tablet [Pharmacy Med Name: ROSUVASTATIN CALCIUM 10 MG TAB] 90 tablet 0    Sig: TAKE 1 TABLET BY MOUTH EVERY DAY     Cardiovascular:  Antilipid - Statins 2 Failed - 05/08/2023  2:33 AM      Failed - Valid encounter within last 12 months    Recent Outpatient Visits           12 months ago General medical exam   [facility] Donita Brooks, MD   2 years ago General medical exam   [facility] Donita Brooks, MD   2 years ago Diarrhea of presumed infectious origin   [facility] [place], Priscille Heidelberg, MD   2 years ago Recurrent sinus infections   [facility] [place], Velna Hatchet, MD   5 years ago Routine general medical examination at a health care facility   [facility] [place], Priscille Heidelberg, MD       Future Appointments             In 1 week Tanya Nones, Priscille Heidelberg, MD  [facility], [facility]            Failed - Lipid Panel in normal range within the last 12 months    Cholesterol  Date Value Ref Range Status  11/10/2022 150 <200 mg/dL Final   LDL Cholesterol (Calc)  Date Value Ref Range Status  11/10/2022 77 mg/dL (calc) Final    Comment:    Reference range: <100 . Desirable range <100 mg/dL for primary prevention;   <70 mg/dL for patients with CHD or diabetic patients  with > or = 2 CHD risk factors. Marland Kitchen LDL-C is now calculated using the Martin-Hopkins  calculation, which is a validated novel method providing  better accuracy than the Friedewald equation in the  estimation of LDL-C.  Horald Pollen et al. Lenox Ahr. 1610;960(45): 2061-2068  (http://education.[facility].com/faq/FAQ164)    HDL  Date Value Ref Range Status  11/10/2022 58 > OR = 50 mg/dL Final   Triglycerides  Date Value Ref Range Status  11/10/2022 69 <150 mg/dL Final         Passed - Cr in normal range and within  360 days    Creat  Date Value Ref Range Status  11/10/2022 0.87 0.60 - 1.00 mg/dL Final   Creatinine, Urine  Date Value Ref Range Status  11/10/2022 174 20 - 275 mg/dL Final         Passed - Patient is not pregnant

## 2023-05-12 ENCOUNTER — Telehealth: Payer: Self-pay

## 2023-05-12 DIAGNOSIS — E039 Hypothyroidism, unspecified: Secondary | ICD-10-CM

## 2023-05-12 DIAGNOSIS — I10 Essential (primary) hypertension: Secondary | ICD-10-CM

## 2023-05-12 MED ORDER — BENAZEPRIL HCL 20 MG PO TABS
20.0000 mg | ORAL_TABLET | Freq: Every day | ORAL | 0 refills | Status: DC
Start: 2023-05-12 — End: 2023-12-07

## 2023-05-12 MED ORDER — LEVOTHYROXINE SODIUM 75 MCG PO TABS
75.0000 ug | ORAL_TABLET | Freq: Every day | ORAL | 0 refills | Status: DC
Start: 2023-05-12 — End: 2023-07-10

## 2023-05-12 NOTE — Telephone Encounter (Signed)
Pt called in stating that these meds benazepril (LOTENSIN) 20 MG tablet [[id number]] and levothyroxine (SYNTHROID) 75 MCG tablet [[id number]] will not be available to her until Thurs 5/16 thru pt's mail order pharmacy. Pt states that she is completely out of these meds and wanted to know if she could get a courtesy refill. Please advise.  Cb#: [phone]  LOV: 03/17/23/ UPCOMING 6 MTH F/U 05/19/23

## 2023-05-15 ENCOUNTER — Other Ambulatory Visit: Payer: Medicare HMO

## 2023-05-15 DIAGNOSIS — I10 Essential (primary) hypertension: Secondary | ICD-10-CM | POA: Diagnosis not present

## 2023-05-15 DIAGNOSIS — R739 Hyperglycemia, unspecified: Secondary | ICD-10-CM | POA: Diagnosis not present

## 2023-05-15 DIAGNOSIS — R7303 Prediabetes: Secondary | ICD-10-CM | POA: Diagnosis not present

## 2023-05-15 DIAGNOSIS — E7800 Pure hypercholesterolemia, unspecified: Secondary | ICD-10-CM

## 2023-05-15 DIAGNOSIS — E039 Hypothyroidism, unspecified: Secondary | ICD-10-CM

## 2023-05-15 LAB — CBC WITH DIFFERENTIAL/PLATELET
MCV: 93.5 fL (ref 80.0–100.0)
Neutrophils Relative %: 61 %
Platelets: 296 10*3/uL (ref 140–400)
RDW: 12 % (ref 11.0–15.0)

## 2023-05-16 LAB — CBC WITH DIFFERENTIAL/PLATELET
Absolute Monocytes: 752 cells/uL (ref 200–950)
Basophils Absolute: 44 cells/uL (ref 0–200)
Basophils Relative: 0.6 %
Eosinophils Absolute: 183 cells/uL (ref 15–500)
Eosinophils Relative: 2.5 %
HCT: 38.9 % (ref 35.0–45.0)
Hemoglobin: 12.8 g/dL (ref 11.7–15.5)
Lymphs Abs: 1869 cells/uL (ref 850–3900)
MCH: 30.8 pg (ref 27.0–33.0)
MCHC: 32.9 g/dL (ref 32.0–36.0)
MPV: 9.9 fL (ref 7.5–12.5)
Monocytes Relative: 10.3 %
Neutro Abs: 4453 cells/uL (ref 1500–7800)
RBC: 4.16 10*6/uL (ref 3.80–5.10)
Total Lymphocyte: 25.6 %
WBC: 7.3 10*3/uL (ref 3.8–10.8)

## 2023-05-16 LAB — HEMOGLOBIN A1C
Hgb A1c MFr Bld: 6.3 % of total Hgb — ABNORMAL HIGH (ref <5.7)
Mean Plasma Glucose: 134 mg/dL
eAG (mmol/L): 7.4 mmol/L

## 2023-05-16 LAB — LIPID PANEL
Cholesterol: 131 mg/dL (ref <200)
HDL: 54 mg/dL (ref >=50)
LDL Cholesterol (Calc): 64 mg/dL (calc)
Non-HDL Cholesterol (Calc): 77 mg/dL (calc) (ref <130)
Total CHOL/HDL Ratio: 2.4 (calc) (ref <5.0)
Triglycerides: 57 mg/dL (ref <150)

## 2023-05-16 LAB — COMPLETE METABOLIC PANEL WITH GFR
AG Ratio: 1.5 (calc) (ref 1.0–2.5)
ALT: 19 U/L (ref 6–29)
AST: 24 U/L (ref 10–35)
Albumin: 4.1 g/dL (ref 3.6–5.1)
Alkaline phosphatase (APISO): 67 U/L (ref 37–153)
BUN: 19 mg/dL (ref 7–25)
CO2: 28 mmol/L (ref 20–32)
Calcium: 9.5 mg/dL (ref 8.6–10.4)
Chloride: 104 mmol/L (ref 98–110)
Creat: 0.92 mg/dL (ref 0.60–1.00)
Globulin: 2.7 g/dL (calc) (ref 1.9–3.7)
Glucose, Bld: 99 mg/dL (ref 65–99)
Potassium: 4.3 mmol/L (ref 3.5–5.3)
Sodium: 141 mmol/L (ref 135–146)
Total Bilirubin: 0.5 mg/dL (ref 0.2–1.2)
Total Protein: 6.8 g/dL (ref 6.1–8.1)
eGFR: 65 mL/min/{1.73_m2} (ref >=60)

## 2023-05-16 LAB — TSH: TSH: 2.34 mIU/L (ref 0.40–4.50)

## 2023-05-19 ENCOUNTER — Encounter: Payer: Self-pay | Admitting: Family Medicine

## 2023-05-19 ENCOUNTER — Ambulatory Visit (INDEPENDENT_AMBULATORY_CARE_PROVIDER_SITE_OTHER): Payer: Medicare HMO | Admitting: Family Medicine

## 2023-05-19 VITALS — BP 140/78 | HR 87 | Temp 98.5°F | Ht 66.0 in | Wt 144.8 lb

## 2023-05-19 DIAGNOSIS — E039 Hypothyroidism, unspecified: Secondary | ICD-10-CM

## 2023-05-19 DIAGNOSIS — I10 Essential (primary) hypertension: Secondary | ICD-10-CM | POA: Diagnosis not present

## 2023-05-19 DIAGNOSIS — E7800 Pure hypercholesterolemia, unspecified: Secondary | ICD-10-CM

## 2023-05-19 NOTE — Progress Notes (Signed)
Subjective:    Patient ID: Marilyn [name], female    DOB: [birth year], 77 y.o.   MRN: [id number]  Patient is a very pleasant 77 year old Caucasian female here today for checkup.  The last time I saw the patient, she was concerned about some mild memory impairment.  She states that she has not noticed any worsening of her memory.  She denies any short-term memory loss.  She denies any confusion.  Overall she states that she is doing well.  Her most recent lab work is listed below Lab on 05/15/2023  Component Date Value Ref Range Status   WBC 05/15/2023 7.3  3.8 - 10.8 Thousand/uL Final   RBC 05/15/2023 4.16  3.80 - 5.10 Million/uL Final   Hemoglobin 05/15/2023 12.8  11.7 - 15.5 g/dL Final   HCT 40/98/1191 38.9  35.0 - 45.0 % Final   MCV 05/15/2023 93.5  80.0 - 100.0 fL Final   MCH 05/15/2023 30.8  27.0 - 33.0 pg Final   MCHC 05/15/2023 32.9  32.0 - 36.0 g/dL Final   RDW 47/82/9562 12.0  11.0 - 15.0 % Final   Platelets 05/15/2023 296  140 - 400 Thousand/uL Final   MPV 05/15/2023 9.9  7.5 - 12.5 fL Final   Neutro Abs 05/15/2023 4,453  1,500 - 7,800 cells/uL Final   Lymphs Abs 05/15/2023 1,869  850 - 3,900 cells/uL Final   Absolute Monocytes 05/15/2023 752  200 - 950 cells/uL Final   Eosinophils Absolute 05/15/2023 183  15 - 500 cells/uL Final   Basophils Absolute 05/15/2023 44  0 - 200 cells/uL Final   Neutrophils Relative % 05/15/2023 61  % Final   Total Lymphocyte 05/15/2023 25.6  % Final   Monocytes Relative 05/15/2023 10.3  % Final   Eosinophils Relative 05/15/2023 2.5  % Final   Basophils Relative 05/15/2023 0.6  % Final   Glucose, Bld 05/15/2023 99  65 - 99 mg/dL Final   Comment: .            Fasting reference interval .    BUN 05/15/2023 19  7 - 25 mg/dL Final   Creat 13/07/6577 0.92  0.60 - 1.00 mg/dL Final   eGFR 46/96/2952 65  > OR = 60 mL/min/1.16m2 Final   BUN/Creatinine Ratio 05/15/2023 SEE NOTE:  6 - 22 (calc) Final   Comment:    Not Reported: BUN and Creatinine are  within    reference range. .    Sodium 05/15/2023 141  135 - 146 mmol/L Final   Potassium 05/15/2023 4.3  3.5 - 5.3 mmol/L Final   Chloride 05/15/2023 104  98 - 110 mmol/L Final   CO2 05/15/2023 28  20 - 32 mmol/L Final   Calcium 05/15/2023 9.5  8.6 - 10.4 mg/dL Final   Total Protein 84/13/2440 6.8  6.1 - 8.1 g/dL Final   Albumin 10/25/2535 4.1  3.6 - 5.1 g/dL Final   Globulin 64/40/3474 2.7  1.9 - 3.7 g/dL (calc) Final   AG Ratio 05/15/2023 1.5  1.0 - 2.5 (calc) Final   Total Bilirubin 05/15/2023 0.5  0.2 - 1.2 mg/dL Final   Alkaline phosphatase (APISO) 05/15/2023 67  37 - 153 U/L Final   AST 05/15/2023 24  10 - 35 U/L Final   ALT 05/15/2023 19  6 - 29 U/L Final   Cholesterol 05/15/2023 131  <200 mg/dL Final   HDL 25/95/6387 54  > OR = 50 mg/dL Final   Triglycerides 56/43/3295 57  <150 mg/dL Final  LDL Cholesterol (Calc) 05/15/2023 64  mg/dL (calc) Final   Comment: Reference range: <100 . Desirable range <100 mg/dL for primary prevention;   <70 mg/dL for patients with CHD or diabetic patients  with > or = 2 CHD risk factors. Marland Kitchen LDL-C is now calculated using the Martin-Hopkins  calculation, which is a validated novel method providing  better accuracy than the Friedewald equation in the  estimation of LDL-C.  Horald Pollen et al. Lenox Ahr. 0981;191(47): 2061-2068  (http://education.[facility].com/faq/FAQ164)    Total CHOL/HDL Ratio 05/15/2023 2.4  <8.2 (calc) Final   Non-HDL Cholesterol (Calc) 05/15/2023 77  <130 mg/dL (calc) Final   Comment: For patients with diabetes plus 1 major ASCVD risk  factor, treating to a non-HDL-C goal of <100 mg/dL  (LDL-C of <95 mg/dL) is considered a therapeutic  option.    Hgb A1c MFr Bld 05/15/2023 6.3 (H)  <5.7 % of total Hgb Final   Comment: For someone without known diabetes, a hemoglobin  A1c value between 5.7% and 6.4% is consistent with prediabetes and should be confirmed with a  follow-up test. . For someone with known diabetes, a  value <7% indicates that their diabetes is well controlled. A1c targets should be individualized based on duration of diabetes, age, comorbid conditions, and other considerations. . This assay result is consistent with an increased risk of diabetes. . Currently, no consensus exists regarding use of hemoglobin A1c for diagnosis of diabetes for children. .    Mean Plasma Glucose 05/15/2023 134  mg/dL Final   eAG (mmol/L) 62/13/0865 7.4  mmol/L Final   Comment: . This test was performed on the Roche cobas c503 platform. Effective 10/06/22, a change in test platforms from the Abbott Architect to the Roche cobas c503 may have shifted HbA1c results compared to historical results. Based on laboratory validation testing conducted at [facility], the Roche platform relative to the Abbott platform had an average increase in HbA1c value of < or = 0.3%. This difference is within accepted  variability established by the [facility]. Note that not all individuals will have had a shift in their results and direct comparisons between historical and current results for testing conducted on different platforms is not recommended.    TSH 05/15/2023 2.34  0.40 - 4.50 mIU/L Final  Abstract on 05/04/2023  Component Date Value Ref Range Status   HM Mammogram 03/09/2023 0-4 Bi-Rad  0-4 Bi-Rad, Self Reported Normal Final      Past Medical History:  Diagnosis Date   Bulging disc    Hypertension    Osteoporosis    Rosacea    Thyroid disease    Past Surgical History:  Procedure Laterality Date   BILATERAL TEMPOROMANDIBULAR JOINT ARTHROPLASTY     TUBAL LIGATION     Current Outpatient Medications on File Prior to Visit  Medication Sig Dispense Refill   Ascorbic Acid (VITAMIN C) 100 MG tablet Take 100 mg daily by mouth.     aspirin 81 MG tablet Take 81 mg by mouth daily.     benazepril (LOTENSIN) 20 MG tablet Take 1 tablet (20 mg total) by mouth daily. 60 tablet  0   BIOTIN PO Take 1 tablet by mouth daily.     calcium carbonate (OS-CAL) 600 MG TABS Take 600 mg by mouth daily.     Cholecalciferol (VITAMIN D) 2000 UNITS tablet Take 2,000 Units by mouth daily.     Glucosamine-Chondroitin (COSAMIN DS PO) Take by mouth daily. Take 1 pill on odd days and  2 pills on even days     levocetirizine (XYZAL) 5 MG tablet TAKE 1 TABLET BY MOUTH EVERY DAY IN THE EVENING 90 tablet 3   levothyroxine (SYNTHROID) 75 MCG tablet Take 1 tablet (75 mcg total) by mouth daily. This is a courtesy refill. Pt must keep appt for future refills 60 tablet 0   PFIZER COVID-19 VAC BIVALENT injection      rosuvastatin (CRESTOR) 10 MG tablet TAKE 1 TABLET BY MOUTH EVERY DAY 90 tablet 0   Sodium Chloride-Xylitol (XLEAR SINUS CARE SPRAY NA) Place into the nose.     No current facility-administered medications on file prior to visit.   Allergies  Allergen Reactions   Other Diarrhea   Social History   Socioeconomic History   Marital status: Married    Spouse name: Not on file   Number of children: Not on file   Years of education: Not on file   Highest education level: 12th grade  Occupational History   Not on file  Tobacco Use   Smoking status: Former    Types: Cigarettes    Quit date: 02/24/1992    Years since quitting: 31.2   Smokeless tobacco: Never  Substance and Sexual Activity   Alcohol use: No   Drug use: No   Sexual activity: Not on file  Other Topics Concern   Not on file  Social History Narrative   Not on file   Social Determinants of Health   Financial Resource Strain: Low Risk  (05/18/2023)   Overall Financial Resource Strain (CARDIA)    Difficulty of Paying Living Expenses: Not hard at all  Food Insecurity: No Food Insecurity (05/18/2023)   Hunger Vital Sign    Worried About Running Out of Food in the Last Year: Never true    Ran Out of Food in the Last Year: Never true  Transportation Needs: No Transportation Needs (05/18/2023)   PRAPARE -  Administrator, Civil Service (Medical): No    Lack of Transportation (Non-Medical): No  Physical Activity: Insufficiently Active (05/18/2023)   Exercise Vital Sign    Days of Exercise per Week: 3 days    Minutes of Exercise per Session: 30 min  Stress: No Stress Concern Present (05/18/2023)   [employer] of Occupational Health - Occupational Stress Questionnaire    Feeling of Stress : Only a little  Social Connections: Socially Integrated (05/18/2023)   Social Connection and Isolation Panel [NHANES]    Frequency of Communication with Friends and Family: Twice a week    Frequency of Social Gatherings with Friends and Family: Once a week    Attends Religious Services: More than 4 times per year    Active Member of [employer] or Organizations: Yes    Attends Banker Meetings: More than 4 times per year    Marital Status: Married  Catering manager Violence: Not At Risk (04/23/2023)   Humiliation, Afraid, Rape, and Kick questionnaire    Fear of Current or Ex-Partner: No    Emotionally Abused: No    Physically Abused: No    Sexually Abused: No      Review of Systems  Gastrointestinal:  Positive for diarrhea.  All other systems reviewed and are negative.      Objective:   Physical Exam Vitals reviewed.  Cardiovascular:     Rate and Rhythm: Normal rate and regular rhythm.     Heart sounds: Normal heart sounds.  Pulmonary:     Effort: Pulmonary effort is normal.  No respiratory distress.     Breath sounds: Normal breath sounds. No wheezing or rales.  Abdominal:     General: Abdomen is flat. Bowel sounds are normal. There is no distension.     Palpations: Abdomen is soft. There is no mass.     Tenderness: There is no abdominal tenderness. There is no right CVA tenderness, left CVA tenderness, guarding or rebound.     Hernia: No hernia is present.  Musculoskeletal:     Cervical back: Neck supple.  Lymphadenopathy:     Cervical: No cervical adenopathy.            Assessment & Plan:  Hypothyroidism, unspecified type - Plan: CBC with Differential/Platelet, COMPLETE METABOLIC PANEL WITH GFR, Lipid panel, TSH  Primary hypertension - Plan: CBC with Differential/Platelet, COMPLETE METABOLIC PANEL WITH GFR, Lipid panel, TSH  Pure hypercholesterolemia - Plan: CBC with Differential/Platelet, COMPLETE METABOLIC PANEL WITH GFR, Lipid panel, TSH Patient's thyroid test today is normal.  Will make no changes in her dose of levothyroxine.  Her blood pressure today is acceptable for her age.  Her cholesterol is outstanding.  We again discussed memory loss.  The patient thinks that her memory is stable.  She has not seen any progression or concerning memory loss so at present time we will make no changes in her therapy.  Recheck the patient in 6 months.  Did recommend adding a probiotic daily to help with diarrhea after C. difficile

## 2023-07-10 ENCOUNTER — Other Ambulatory Visit: Payer: Self-pay | Admitting: Family Medicine

## 2023-07-10 DIAGNOSIS — E039 Hypothyroidism, unspecified: Secondary | ICD-10-CM

## 2023-07-10 NOTE — Telephone Encounter (Signed)
Requested Prescriptions  Pending Prescriptions Disp Refills   levothyroxine (SYNTHROID) 75 MCG tablet [Pharmacy Med Name: LEVOTHYROXIN TAB 75MCG] 90 tablet 3    Sig: TAKE 1 TABLET DAILY     Endocrinology:  Hypothyroid Agents Failed - 07/10/2023 12:33 PM      Failed - Valid encounter within last 12 months    Recent Outpatient Visits           1 year ago General medical exam   [facility] Donita Brooks, MD   2 years ago General medical exam   [facility] Donita Brooks, MD   2 years ago Diarrhea of presumed infectious origin   [facility] [place], Priscille Heidelberg, MD   3 years ago Recurrent sinus infections   [facility] [place], Velna Hatchet, MD   5 years ago Routine general medical examination at a health care facility   [facility] [place], Priscille Heidelberg, MD              Passed - TSH in normal range and within 360 days    TSH  Date Value Ref Range Status  05/15/2023 2.34 0.40 - 4.50 mIU/L Final

## 2023-07-31 ENCOUNTER — Other Ambulatory Visit: Payer: Self-pay | Admitting: Family Medicine

## 2023-07-31 NOTE — Telephone Encounter (Signed)
Due to a system glitch the protocol does not detect the last office visit for this practice.   LOV 05/19/2023.  Labs are in date.  Requested Prescriptions  Pending Prescriptions Disp Refills   rosuvastatin (CRESTOR) 10 MG tablet [Pharmacy Med Name: ROSUVASTATIN CALCIUM 10 MG TAB] 90 tablet 1    Sig: TAKE 1 TABLET BY MOUTH EVERY DAY     Cardiovascular:  Antilipid - Statins 2 Failed - 07/31/2023  2:50 AM      Failed - Valid encounter within last 12 months    Recent Outpatient Visits           1 year ago General medical exam   [facility] Donita Brooks, MD   2 years ago General medical exam   [facility] Donita Brooks, MD   2 years ago Diarrhea of presumed infectious origin   [facility] [place], Priscille Heidelberg, MD   3 years ago Recurrent sinus infections   [facility] [place], Velna Hatchet, MD   5 years ago Routine general medical examination at a health care facility   [facility] [place], Priscille Heidelberg, MD              Failed - Lipid Panel in normal range within the last 12 months    Cholesterol  Date Value Ref Range Status  05/15/2023 131 <200 mg/dL Final   LDL Cholesterol (Calc)  Date Value Ref Range Status  05/15/2023 64 mg/dL (calc) Final    Comment:    Reference range: <100 . Desirable range <100 mg/dL for primary prevention;   <70 mg/dL for patients with CHD or diabetic patients  with > or = 2 CHD risk factors. Marland Kitchen LDL-C is now calculated using the Martin-Hopkins  calculation, which is a validated novel method providing  better accuracy than the Friedewald equation in the  estimation of LDL-C.  Horald Pollen et al. Lenox Ahr. 3220;254(27): 2061-2068  (http://education.[facility].com/faq/FAQ164)    HDL  Date Value Ref Range Status  05/15/2023 54 > OR = 50 mg/dL Final   Triglycerides  Date Value Ref Range Status  05/15/2023 57 <150 mg/dL Final         Passed - Cr in normal range and  within 360 days    Creat  Date Value Ref Range Status  05/15/2023 0.92 0.60 - 1.00 mg/dL Final   Creatinine, Urine  Date Value Ref Range Status  11/10/2022 174 20 - 275 mg/dL Final         Passed - Patient is not pregnant

## 2023-10-14 DIAGNOSIS — R208 Other disturbances of skin sensation: Secondary | ICD-10-CM | POA: Diagnosis not present

## 2023-10-14 DIAGNOSIS — L538 Other specified erythematous conditions: Secondary | ICD-10-CM | POA: Diagnosis not present

## 2023-10-14 DIAGNOSIS — L814 Other melanin hyperpigmentation: Secondary | ICD-10-CM | POA: Diagnosis not present

## 2023-10-14 DIAGNOSIS — L820 Inflamed seborrheic keratosis: Secondary | ICD-10-CM | POA: Diagnosis not present

## 2023-10-14 DIAGNOSIS — D225 Melanocytic nevi of trunk: Secondary | ICD-10-CM | POA: Diagnosis not present

## 2023-10-14 DIAGNOSIS — L821 Other seborrheic keratosis: Secondary | ICD-10-CM | POA: Diagnosis not present

## 2023-11-16 ENCOUNTER — Telehealth: Payer: Self-pay

## 2023-11-16 NOTE — Telephone Encounter (Signed)
Appt for labs made for 12/3 @ 9 am. Mjp,lpn  Copied from CRM [id number]. Topic: Appointments - Appointment Scheduling >> Nov 16, 2023  2:41 PM Larwance Sachs wrote: Patient/patient representative is calling to schedule an appointment. Refer to attachments for appointment information.   Patient wants to confirm if she needs to come in day before appointment for bloodwork

## 2023-11-25 ENCOUNTER — Other Ambulatory Visit: Payer: Self-pay | Admitting: Family Medicine

## 2023-11-25 DIAGNOSIS — I10 Essential (primary) hypertension: Secondary | ICD-10-CM

## 2023-11-25 NOTE — Telephone Encounter (Signed)
Requested medication (s) are due for refill today: yes  Requested medication (s) are on the active medication list: yes  Last refill:  05/12/23 #60/0  Future visit scheduled: yes  Notes to clinic:  Unable to refill per protocol due to failed labs, no updated results.      Requested Prescriptions  Pending Prescriptions Disp Refills   benazepril (LOTENSIN) 20 MG tablet [Pharmacy Med Name: BENAZEPRIL TAB 20MG ] 90 tablet 1    Sig: TAKE 1 TABLET DAILY     Cardiovascular:  ACE Inhibitors Failed - 11/25/2023  4:55 PM      Failed - Cr in normal range and within 180 days    Creat  Date Value Ref Range Status  05/15/2023 0.92 0.60 - 1.00 mg/dL Final   Creatinine, Urine  Date Value Ref Range Status  11/10/2022 174 20 - 275 mg/dL Final         Failed - K in normal range and within 180 days    Potassium  Date Value Ref Range Status  05/15/2023 4.3 3.5 - 5.3 mmol/L Final         Failed - Last BP in normal range    BP Readings from Last 1 Encounters:  05/19/23 (!) 140/78         Failed - Valid encounter within last 6 months    Recent Outpatient Visits           1 year ago General medical exam   [facility] Donita Brooks, MD   2 years ago General medical exam   [facility] Donita Brooks, MD   3 years ago Diarrhea of presumed infectious origin   [facility] [place], Priscille Heidelberg, MD   3 years ago Recurrent sinus infections   [facility] [place], Velna Hatchet, MD   6 years ago Routine general medical examination at a health care facility   [facility] [place], Priscille Heidelberg, MD              Passed - Patient is not pregnant

## 2023-12-01 ENCOUNTER — Other Ambulatory Visit: Payer: Medicare HMO

## 2023-12-01 DIAGNOSIS — E7800 Pure hypercholesterolemia, unspecified: Secondary | ICD-10-CM

## 2023-12-01 DIAGNOSIS — E039 Hypothyroidism, unspecified: Secondary | ICD-10-CM

## 2023-12-01 DIAGNOSIS — I10 Essential (primary) hypertension: Secondary | ICD-10-CM

## 2023-12-02 LAB — CBC WITH DIFFERENTIAL/PLATELET
Absolute Lymphocytes: 1617 {cells}/uL (ref 850–3900)
Absolute Monocytes: 686 {cells}/uL (ref 200–950)
Basophils Absolute: 28 {cells}/uL (ref 0–200)
Basophils Relative: 0.4 %
Eosinophils Absolute: 273 {cells}/uL (ref 15–500)
Eosinophils Relative: 3.9 %
HCT: 39.7 % (ref 35.0–45.0)
Hemoglobin: 13.1 g/dL (ref 11.7–15.5)
MCH: 31 pg (ref 27.0–33.0)
MCHC: 33 g/dL (ref 32.0–36.0)
MCV: 94.1 fL (ref 80.0–100.0)
MPV: 10.4 fL (ref 7.5–12.5)
Monocytes Relative: 9.8 %
Neutro Abs: 4396 {cells}/uL (ref 1500–7800)
Neutrophils Relative %: 62.8 %
Platelets: 303 10*3/uL (ref 140–400)
RBC: 4.22 10*6/uL (ref 3.80–5.10)
RDW: 11.9 % (ref 11.0–15.0)
Total Lymphocyte: 23.1 %
WBC: 7 10*3/uL (ref 3.8–10.8)

## 2023-12-02 LAB — COMPLETE METABOLIC PANEL WITH GFR
AG Ratio: 1.6 (calc) (ref 1.0–2.5)
ALT: 14 U/L (ref 6–29)
AST: 21 U/L (ref 10–35)
Albumin: 4.2 g/dL (ref 3.6–5.1)
Alkaline phosphatase (APISO): 70 U/L (ref 37–153)
BUN: 16 mg/dL (ref 7–25)
CO2: 29 mmol/L (ref 20–32)
Calcium: 10.2 mg/dL (ref 8.6–10.4)
Chloride: 104 mmol/L (ref 98–110)
Creat: 0.79 mg/dL (ref 0.60–1.00)
Globulin: 2.6 g/dL (ref 1.9–3.7)
Glucose, Bld: 107 mg/dL — ABNORMAL HIGH (ref 65–99)
Potassium: 4.5 mmol/L (ref 3.5–5.3)
Sodium: 140 mmol/L (ref 135–146)
Total Bilirubin: 0.4 mg/dL (ref 0.2–1.2)
Total Protein: 6.8 g/dL (ref 6.1–8.1)
eGFR: 77 mL/min/{1.73_m2} (ref >=60)

## 2023-12-02 LAB — LIPID PANEL
Cholesterol: 139 mg/dL (ref <200)
HDL: 54 mg/dL (ref >=50)
LDL Cholesterol (Calc): 71 mg/dL
Non-HDL Cholesterol (Calc): 85 mg/dL (ref <130)
Total CHOL/HDL Ratio: 2.6 (calc) (ref <5.0)
Triglycerides: 59 mg/dL (ref <150)

## 2023-12-02 LAB — TSH: TSH: 0.57 m[IU]/L (ref 0.40–4.50)

## 2023-12-04 ENCOUNTER — Ambulatory Visit (INDEPENDENT_AMBULATORY_CARE_PROVIDER_SITE_OTHER): Payer: Medicare HMO | Admitting: Family Medicine

## 2023-12-04 ENCOUNTER — Encounter: Payer: Self-pay | Admitting: Family Medicine

## 2023-12-04 VITALS — BP 128/80 | HR 61 | Temp 97.9°F | Ht 66.0 in | Wt 142.0 lb

## 2023-12-04 DIAGNOSIS — I10 Essential (primary) hypertension: Secondary | ICD-10-CM

## 2023-12-04 DIAGNOSIS — E039 Hypothyroidism, unspecified: Secondary | ICD-10-CM

## 2023-12-04 DIAGNOSIS — E7800 Pure hypercholesterolemia, unspecified: Secondary | ICD-10-CM

## 2023-12-04 NOTE — Progress Notes (Signed)
Subjective:    Patient ID: Marilyn [name], female    DOB: 07-16-1946, 77 y.o.   MRN: [id number]  Patient is a very pleasant 77 year old Caucasian female here today for checkup.  We have been monitoring the patient for mild memory loss that she mentioned about 1 year ago.  Patient denies that she is seeing any deterioration in her memory.  She denies forgetting people's names.  She denies leaving on the stove.  She denies getting lost while driving.  She denies any trouble finding words.  She denies anyone complaining about having to repeat answers to questions that she continually asked.  She denies any trouble losing money or other items.  Therefore does not appear that there is been any progression of her memory loss.  She denies any chest pain or shortness of breath.  Her blood pressure today is outstanding.  She has had her flu shot and her COVID-vaccine.  Most recent lab work is listed below and aside from a mild elevation in her blood sugar the labs look excellent Lab on 12/01/2023  Component Date Value Ref Range Status   WBC 12/01/2023 7.0  3.8 - 10.8 Thousand/uL Final   RBC 12/01/2023 4.22  3.80 - 5.10 Million/uL Final   Hemoglobin 12/01/2023 13.1  11.7 - 15.5 g/dL Final   HCT 40/98/1191 39.7  35.0 - 45.0 % Final   MCV 12/01/2023 94.1  80.0 - 100.0 fL Final   MCH 12/01/2023 31.0  27.0 - 33.0 pg Final   MCHC 12/01/2023 33.0  32.0 - 36.0 g/dL Final   Comment: For adults, a slight decrease in the calculated MCHC value (in the range of 30 to 32 g/dL) is most likely not clinically significant; however, it should be interpreted with caution in correlation with other red cell parameters and the patient's clinical condition.    RDW 12/01/2023 11.9  11.0 - 15.0 % Final   Platelets 12/01/2023 303  140 - 400 Thousand/uL Final   MPV 12/01/2023 10.4  7.5 - 12.5 fL Final   Neutro Abs 12/01/2023 4,396  1,500 - 7,800 cells/uL Final   Absolute Lymphocytes 12/01/2023 1,617  850 - 3,900 cells/uL  Final   Absolute Monocytes 12/01/2023 686  200 - 950 cells/uL Final   Eosinophils Absolute 12/01/2023 273  15 - 500 cells/uL Final   Basophils Absolute 12/01/2023 28  0 - 200 cells/uL Final   Neutrophils Relative % 12/01/2023 62.8  % Final   Total Lymphocyte 12/01/2023 23.1  % Final   Monocytes Relative 12/01/2023 9.8  % Final   Eosinophils Relative 12/01/2023 3.9  % Final   Basophils Relative 12/01/2023 0.4  % Final   Glucose, Bld 12/01/2023 107 (H)  65 - 99 mg/dL Final   Comment: .            Fasting reference interval . For someone without known diabetes, a glucose value between 100 and 125 mg/dL is consistent with prediabetes and should be confirmed with a follow-up test. .    BUN 12/01/2023 16  7 - 25 mg/dL Final   Creat 47/82/9562 0.79  0.60 - 1.00 mg/dL Final   eGFR 13/07/6577 77  > OR = 60 mL/min/1.75m2 Final   BUN/Creatinine Ratio 12/01/2023 SEE NOTE:  6 - 22 (calc) Final   Comment:    Not Reported: BUN and Creatinine are within    reference range. .    Sodium 12/01/2023 140  135 - 146 mmol/L Final   Potassium 12/01/2023 4.5  3.5 -  5.3 mmol/L Final   Chloride 12/01/2023 104  98 - 110 mmol/L Final   CO2 12/01/2023 29  20 - 32 mmol/L Final   Calcium 12/01/2023 10.2  8.6 - 10.4 mg/dL Final   Total Protein 27/25/3664 6.8  6.1 - 8.1 g/dL Final   Albumin 40/34/7425 4.2  3.6 - 5.1 g/dL Final   Globulin 95/63/8756 2.6  1.9 - 3.7 g/dL (calc) Final   AG Ratio 12/01/2023 1.6  1.0 - 2.5 (calc) Final   Total Bilirubin 12/01/2023 0.4  0.2 - 1.2 mg/dL Final   Alkaline phosphatase (APISO) 12/01/2023 70  37 - 153 U/L Final   AST 12/01/2023 21  10 - 35 U/L Final   ALT 12/01/2023 14  6 - 29 U/L Final   Cholesterol 12/01/2023 139  <200 mg/dL Final   HDL 43/32/9518 54  > OR = 50 mg/dL Final   Triglycerides 84/16/6063 59  <150 mg/dL Final   LDL Cholesterol (Calc) 12/01/2023 71  mg/dL (calc) Final   Comment: Reference range: <100 . Desirable range <100 mg/dL for primary prevention;    <70 mg/dL for patients with CHD or diabetic patients  with > or = 2 CHD risk factors. Marland Kitchen LDL-C is now calculated using the Martin-Hopkins  calculation, which is a validated novel method providing  better accuracy than the Friedewald equation in the  estimation of LDL-C.  Horald Pollen et al. Lenox Ahr. 0160;109(32): 2061-2068  (http://education.[facility].com/faq/FAQ164)    Total CHOL/HDL Ratio 12/01/2023 2.6  <3.5 (calc) Final   Non-HDL Cholesterol (Calc) 12/01/2023 85  <130 mg/dL (calc) Final   Comment: For patients with diabetes plus 1 major ASCVD risk  factor, treating to a non-HDL-C goal of <100 mg/dL  (LDL-C of <57 mg/dL) is considered a therapeutic  option.    TSH 12/01/2023 0.57  0.40 - 4.50 mIU/L Final      Past Medical History:  Diagnosis Date   Bulging disc    Hypertension    Osteoporosis    Rosacea    Thyroid disease    Past Surgical History:  Procedure Laterality Date   BILATERAL TEMPOROMANDIBULAR JOINT ARTHROPLASTY     TUBAL LIGATION     Current Outpatient Medications on File Prior to Visit  Medication Sig Dispense Refill   Ascorbic Acid (VITAMIN C) 100 MG tablet Take 100 mg daily by mouth.     aspirin 81 MG tablet Take 81 mg by mouth daily.     benazepril (LOTENSIN) 20 MG tablet Take 1 tablet (20 mg total) by mouth daily. 60 tablet 0   calcium carbonate (OS-CAL) 600 MG TABS Take 600 mg by mouth daily.     Cholecalciferol (VITAMIN D) 2000 UNITS tablet Take 2,000 Units by mouth daily.     Glucosamine-Chondroitin (COSAMIN DS PO) Take by mouth daily. Take 1 pill on odd days and 2 pills on even days     levothyroxine (SYNTHROID) 75 MCG tablet TAKE 1 TABLET DAILY 90 tablet 3   Multiple Vitamins-Minerals (CENTRUM SILVER 50+WOMEN PO)      PFIZER COVID-19 VAC BIVALENT injection      rosuvastatin (CRESTOR) 10 MG tablet TAKE 1 TABLET BY MOUTH EVERY DAY 90 tablet 1   No current facility-administered medications on file prior to visit.   Allergies  Allergen  Reactions   Other Diarrhea   Social History   Socioeconomic History   Marital status: Married    Spouse name: Not on file   Number of children: Not on file   Years of education:  Not on file   Highest education level: 12th grade  Occupational History   Not on file  Tobacco Use   Smoking status: Former    Current packs/day: 0.00    Types: Cigarettes    Quit date: 02/24/1992    Years since quitting: 31.7   Smokeless tobacco: Never  Substance and Sexual Activity   Alcohol use: No   Drug use: No   Sexual activity: Not on file  Other Topics Concern   Not on file  Social History Narrative   Not on file   Social Determinants of Health   Financial Resource Strain: Low Risk  (05/18/2023)   Overall Financial Resource Strain (CARDIA)    Difficulty of Paying Living Expenses: Not hard at all  Food Insecurity: No Food Insecurity (05/18/2023)   Hunger Vital Sign    Worried About Running Out of Food in the Last Year: Never true    Ran Out of Food in the Last Year: Never true  Transportation Needs: No Transportation Needs (05/18/2023)   PRAPARE - Administrator, Civil Service (Medical): No    Lack of Transportation (Non-Medical): No  Physical Activity: Insufficiently Active (05/18/2023)   Exercise Vital Sign    Days of Exercise per Week: 3 days    Minutes of Exercise per Session: 30 min  Stress: No Stress Concern Present (05/18/2023)   [employer] of Occupational Health - Occupational Stress Questionnaire    Feeling of Stress : Only a little  Social Connections: Socially Integrated (05/18/2023)   Social Connection and Isolation Panel [NHANES]    Frequency of Communication with Friends and Family: Twice a week    Frequency of Social Gatherings with Friends and Family: Once a week    Attends Religious Services: More than 4 times per year    Active Member of [employer] or Organizations: Yes    Attends Banker Meetings: More than 4 times per year    Marital Status:  Married  Catering manager Violence: Not At Risk (04/23/2023)   Humiliation, Afraid, Rape, and Kick questionnaire    Fear of Current or Ex-Partner: No    Emotionally Abused: No    Physically Abused: No    Sexually Abused: No      Review of Systems  Gastrointestinal:  Positive for diarrhea.  All other systems reviewed and are negative.      Objective:   Physical Exam Vitals reviewed.  Cardiovascular:     Rate and Rhythm: Normal rate and regular rhythm.     Heart sounds: Normal heart sounds.  Pulmonary:     Effort: Pulmonary effort is normal. No respiratory distress.     Breath sounds: Normal breath sounds. No wheezing or rales.  Abdominal:     General: Abdomen is flat. Bowel sounds are normal. There is no distension.     Palpations: Abdomen is soft. There is no mass.     Tenderness: There is no abdominal tenderness. There is no right CVA tenderness, left CVA tenderness, guarding or rebound.     Hernia: No hernia is present.  Musculoskeletal:     Cervical back: Neck supple.  Lymphadenopathy:     Cervical: No cervical adenopathy.           Assessment & Plan:  Hypothyroidism, unspecified type  Primary hypertension  Pure hypercholesterolemia Patient's thyroid test today is within normal limits.  Her blood pressure is excellent and her cholesterol is outstanding.  Blood sugar remains mildly elevated but not at a  significant level.  Patient's immunizations are up-to-date.  Will recheck the patient in 6 months or sooner if problems arise.

## 2023-12-07 ENCOUNTER — Telehealth: Payer: Self-pay

## 2023-12-07 ENCOUNTER — Other Ambulatory Visit: Payer: Self-pay

## 2023-12-07 DIAGNOSIS — I10 Essential (primary) hypertension: Secondary | ICD-10-CM

## 2023-12-07 MED ORDER — BENAZEPRIL HCL 20 MG PO TABS: 20.0000 mg | ORAL_TABLET | Freq: Every day | ORAL | 0 refills | Status: DC

## 2023-12-07 NOTE — Telephone Encounter (Signed)
Copied from CRM [id number]. Topic: Clinical - Medication Refill >> Dec 07, 2023 11:33 AM Conni Elliot wrote: Most Recent Primary Care Visit:  Provider: Lynnea Ferrier T  Department: [facility]  Visit Type: OFFICE VISIT  Date: 12/04/2023  Medication: benazepril (LOTENSIN) 20 MG tablet  Has the patient contacted their pharmacy? Yes (Agent: If no, request that the patient contact the pharmacy for the refill. If patient does not wish to contact the pharmacy document the reason why and proceed with request.) (Agent: If yes, when and what did the pharmacy advise?) Jethro Bolus from [facility] called in to request rx refill, callback number is [phone] option 2, reference number is [phone]   Is this the correct pharmacy for this prescription? Yes If no, delete pharmacy and type the correct one.  This is the patient's preferred pharmacy:  [facility] - [place], [place] - [address] AT [address] [facility] [address] [place] [address] Phone: [phone] Fax: [phone]  [id number] [place], [place] - [address] [place] [address] Phone: [phone] Fax: [phone]   Has the prescription been filled recently? Yes  Is the patient out of the medication? Yes  Has the patient been seen for an appointment in the last year OR does the patient have an upcoming appointment? Yes  Can we respond through MyChart? Yes  Agent: Please be advised that Rx refills may take up to 3 business days. We ask that you follow-up with your pharmacy.

## 2024-01-22 ENCOUNTER — Other Ambulatory Visit: Payer: Self-pay | Admitting: Family Medicine

## 2024-01-22 MED ORDER — ROSUVASTATIN CALCIUM 10 MG PO TABS: 10.0000 mg | ORAL_TABLET | Freq: Every day | ORAL | 0 refills | Status: DC

## 2024-01-22 NOTE — Telephone Encounter (Signed)
Requested Prescriptions  Pending Prescriptions Disp Refills   rosuvastatin (CRESTOR) 10 MG tablet 90 tablet 0    Sig: Take 1 tablet (10 mg total) by mouth daily.     Cardiovascular:  Antilipid - Statins 2 Failed - 01/22/2024  2:15 PM      Failed - Valid encounter within last 12 months    Recent Outpatient Visits           1 year ago General medical exam   [facility] Donita Brooks, MD   2 years ago General medical exam   [facility] Donita Brooks, MD   3 years ago Diarrhea of presumed infectious origin   [facility] Pickard, Priscille Heidelberg, MD   3 years ago Recurrent sinus infections   [facility] Cl[place], Velna Hatchet, MD   6 years ago Routine general medical examination at a health care facility   [facility] [place], Priscille Heidelberg, MD              Failed - Lipid Panel in normal range within the last 12 months    Cholesterol  Date Value Ref Range Status  12/01/2023 139 <200 mg/dL Final   LDL Cholesterol (Calc)  Date Value Ref Range Status  12/01/2023 71 mg/dL (calc) Final    Comment:    Reference range: <100 . Desirable range <100 mg/dL for primary prevention;   <70 mg/dL for patients with CHD or diabetic patients  with > or = 2 CHD risk factors. Marland Kitchen LDL-C is now calculated using the Martin-Hopkins  calculation, which is a validated novel method providing  better accuracy than the Friedewald equation in the  estimation of LDL-C.  Horald Pollen et al. Lenox Ahr. 1610;960(45): 2061-2068  (http://education.[facility].com/faq/FAQ164)    HDL  Date Value Ref Range Status  12/01/2023 54 > OR = 50 mg/dL Final   Triglycerides  Date Value Ref Range Status  12/01/2023 59 <150 mg/dL Final         Passed - Cr in normal range and within 360 days    Creat  Date Value Ref Range Status  12/01/2023 0.79 0.60 - 1.00 mg/dL Final   Creatinine, Urine  Date Value Ref Range Status  11/10/2022 174 20 -  275 mg/dL Final         Passed - Patient is not pregnant

## 2024-01-22 NOTE — Telephone Encounter (Signed)
Prescription Request  01/22/2024  LOV: 12/04/2023  What is the name of the medication or equipment?   rosuvastatin (CRESTOR) 10 MG tablet  **90 day script request**  Have you contacted your pharmacy to request a refill? Yes   Which pharmacy would you like this sent to?  [id number] [place], [place] - [address] [place] [address] Phone: [phone] Fax: [phone]    Patient notified that their request is being sent to the clinical staff for review and that they should receive a response within 2 business days.   Please advise pharmacist.

## 2024-02-23 ENCOUNTER — Other Ambulatory Visit: Payer: Self-pay | Admitting: Family Medicine

## 2024-02-23 DIAGNOSIS — I10 Essential (primary) hypertension: Secondary | ICD-10-CM

## 2024-03-15 ENCOUNTER — Other Ambulatory Visit (INDEPENDENT_AMBULATORY_CARE_PROVIDER_SITE_OTHER): Payer: Self-pay

## 2024-03-15 ENCOUNTER — Ambulatory Visit: Admitting: Orthopaedic Surgery

## 2024-03-15 ENCOUNTER — Encounter: Payer: Self-pay | Admitting: Orthopaedic Surgery

## 2024-03-15 VITALS — BP 162/71 | Ht 66.0 in | Wt 140.0 lb

## 2024-03-15 DIAGNOSIS — M2242 Chondromalacia patellae, left knee: Secondary | ICD-10-CM | POA: Insufficient documentation

## 2024-03-15 DIAGNOSIS — M25562 Pain in left knee: Secondary | ICD-10-CM | POA: Diagnosis not present

## 2024-03-15 DIAGNOSIS — M25561 Pain in right knee: Secondary | ICD-10-CM

## 2024-03-15 DIAGNOSIS — M2241 Chondromalacia patellae, right knee: Secondary | ICD-10-CM | POA: Diagnosis not present

## 2024-03-15 DIAGNOSIS — G8929 Other chronic pain: Secondary | ICD-10-CM | POA: Diagnosis not present

## 2024-03-15 NOTE — Progress Notes (Signed)
 Office Visit Note   Patient: Marilyn [name]           Date of Birth: 18-Feb-1946           MRN: [id number] Visit Date: 03/15/2024              Requested by: Donita Brooks, MD [id number] [facility] [address] [place],  [place] [address] PCP: Donita Brooks, MD   Assessment & Plan: Visit Diagnoses:  1. Bilateral chronic knee pain   2. Chondromalacia patellae, left knee   3. Chondromalacia patellae, right knee     Plan: Reviewed x-rays and findings on physical exam.  She has some patellofemoral chondromalacia.  She does not any specific therapy we discussed activities that are more likely to bother her.  She is functioning well and discussed with her that she has minimal medial lateral compartment changes and has maintained her joint space.  Follow-up as needed.  She is getting ready for a trip to [place] and should try some Aleve 2 tablets in the morning to see if this helps with her activity level.  We discussed problems if she took too much anti-inflammatories potential problems with her kidney.  Her renal function is normal.  Follow-Up Instructions: No follow-ups on file.   Orders:  Orders Placed This Encounter  Procedures   XR KNEE 3 VIEW RIGHT   XR KNEE 3 VIEW LEFT   No orders of the defined types were placed in this encounter.     Procedures: No procedures performed   Clinical Data: No additional findings.   Subjective: Chief Complaint  Patient presents with   Right Knee - Pain   Left Knee - Pain    HPI 78 year old female with bilateral knee pain sometimes worse on the right sometimes on the left.  No swelling she has particular problems going from the floor to standing or from chair to standing or up on a tall step.  Certain exercises bother her which all turned out to be ones with patellofemoral loading.  Patient's been healthy she has little bit hypertension and takes calcium and also Synthroid. No associated back pain no groin pain. Review of Systems all other  systems noncontributory to HPI.   Objective: Vital Signs: BP (!) 162/71 (BP Location: Right Arm)   Ht 5\' 6"  (1.676 m)   Wt 140 lb (63.5 kg)   BMI 22.60 kg/m   Physical Exam Constitutional:      Appearance: She is well-developed.  HENT:     Head: Normocephalic.     Right Ear: External ear normal.     Left Ear: External ear normal. There is no impacted cerumen.  Eyes:     Pupils: Pupils are equal, round, and reactive to light.  Neck:     Thyroid: No thyromegaly.     Trachea: No tracheal deviation.  Cardiovascular:     Rate and Rhythm: Normal rate.  Pulmonary:     Effort: Pulmonary effort is normal.  Abdominal:     Palpations: Abdomen is soft.  Musculoskeletal:     Cervical back: No rigidity.  Skin:    General: Skin is warm and dry.  Neurological:     Mental Status: She is alert and oriented to person, place, and time.  Psychiatric:        Behavior: Behavior normal.     Ortho Exam bilateral knee crepitus near terminal extension positive patellofemoral loading no subluxation.  No knee effusion.  She has full extension good  quad strength.  Negative logroll hips.  Specialty Comments:  No specialty comments available.  Imaging: XR KNEE 3 VIEW RIGHT Result Date: 03/15/2024 Standing AP both knees lateral right knee obtained and reviewed.  Sunrise patella view included.  This shows bilateral patellofemoral degenerative changes and no joint space narrowing medial lateral compartment tiny marginal osteophytes. Impression: Right knee patellofemoral degenerative changes.  XR KNEE 3 VIEW LEFT Result Date: 03/15/2024 Standing AP both knees lateral left knee demonstrate some mild patellofemoral degenerative changes.  No sunrise patella film obtained.  Small tiny osteophytes medial and lateral without joint space narrowing. Impression: Mild patellofemoral joint degenerative changes.    PMFS History: Patient Active Problem List   Diagnosis Date Noted   Chondromalacia patellae,  left knee 03/15/2024   Chondromalacia patellae, right knee 03/15/2024   Chronic left maxillary sinusitis 08/27/2020   Hypothyroidism 06/02/2014   Dizziness and giddiness 01/31/2014   Hypertension    Past Medical History:  Diagnosis Date   Bulging disc    Hypertension    Osteoporosis    Rosacea    Thyroid disease     Family History  Problem Relation Age of Onset   Colon cancer Neg Hx     Past Surgical History:  Procedure Laterality Date   BILATERAL TEMPOROMANDIBULAR JOINT ARTHROPLASTY     TUBAL LIGATION     Social History   Occupational History   Not on file  Tobacco Use   Smoking status: Former    Current packs/day: 0.00    Types: Cigarettes    Quit date: 02/24/1992    Years since quitting: 32.0   Smokeless tobacco: Never  Substance and Sexual Activity   Alcohol use: No   Drug use: No   Sexual activity: Not on file

## 2024-04-13 ENCOUNTER — Ambulatory Visit (INDEPENDENT_AMBULATORY_CARE_PROVIDER_SITE_OTHER): Admitting: Family Medicine

## 2024-04-13 ENCOUNTER — Encounter: Payer: Self-pay | Admitting: Family Medicine

## 2024-04-13 VITALS — BP 140/68 | HR 97 | Resp 16 | Ht 66.0 in | Wt 140.1 lb

## 2024-04-13 DIAGNOSIS — R197 Diarrhea, unspecified: Secondary | ICD-10-CM | POA: Diagnosis not present

## 2024-04-13 NOTE — Assessment & Plan Note (Signed)
 No red flags on exam. CMP and stool studies ordered. Encouraged to push electrolyte fluids and increase PO intake as tolerated. Seek medical care if symptoms worsen or persist.

## 2024-04-13 NOTE — Progress Notes (Signed)
 Subjective:  HPI: Marilyn [name] is a 78 y.o. female presenting on 04/13/2024 for Diarrhea (Complains of diarrhea since Monday but has gotten more frequency and severe since last night. Denies vomiting and fever. Was in [place] last week )   Diarrhea    Patient is in today for diarrhea and headache since Monday night with abdominal cramping. Denies nausea, vomiting, fever, melena, hematochezia, known exposures. Is tolerating PO intake, had applesauce, soup, and toast yesterday. Is drinking plenty of water. Symptoms improved yesterday then started back last night. Stools described as watery, unknown number of stools today. She is with her husband today, he is well. 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   Review of Systems  Gastrointestinal:  Positive for diarrhea.  All other systems reviewed and are negative.   Relevant past medical history reviewed and updated as indicated.   Past Medical History:  Diagnosis Date   Bulging disc    Hypertension    Osteoporosis    Rosacea    Thyroid disease      Past Surgical History:  Procedure Laterality Date   BILATERAL TEMPOROMANDIBULAR JOINT ARTHROPLASTY     TUBAL LIGATION      Allergies and medications reviewed and updated.   Current Outpatient Medications:    Ascorbic Acid (VITAMIN C) 100 MG tablet, Take 100 mg daily by mouth., Disp: , Rfl:    aspirin 81 MG tablet, Take 81 mg by mouth daily., Disp: , Rfl:    benazepril (LOTENSIN) 20 MG tablet, TAKE 1 TABLET DAILY, Disp: 60 tablet, Rfl: 0   calcium carbonate (OS-CAL) 600 MG TABS, Take 600 mg by mouth daily., Disp: , Rfl:    Cholecalciferol (VITAMIN D) 2000 UNITS tablet, Take 2,000 Units by mouth daily., Disp: , Rfl:    Glucosamine-Chondroitin (COSAMIN DS PO), Take by mouth daily. Take 1 pill on odd days and 2 pills on even days, Disp: , Rfl:    levothyroxine (SYNTHROID) 75 MCG tablet, TAKE 1 TABLET DAILY, Disp: 90 tablet, Rfl: 3   Multiple Vitamins-Minerals  (CENTRUM SILVER 50+WOMEN PO), , Disp: , Rfl:    PFIZER COVID-19 VAC BIVALENT injection, , Disp: , Rfl:    rosuvastatin (CRESTOR) 10 MG tablet, Take 1 tablet (10 mg total) by mouth daily., Disp: 90 tablet, Rfl: 0  Allergies  Allergen Reactions   Other Diarrhea    Objective:   BP (!) 140/68   Pulse 97   Resp 16   Ht 5\' 6"  (1.676 m)   Wt 140 lb 1 oz (63.5 kg)   SpO2 97%   BMI 22.61 kg/m      04/13/2024    8:29 AM 03/15/2024   10:48 AM 03/15/2024   10:47 AM  Vitals with BMI  Height 5\' 6"  5\' 6"    Weight 140 lbs 1 oz 140 lbs   BMI 22.62 22.61   Systolic 140 162 852  Diastolic 68 71 71  Pulse 97       Physical Exam Vitals and nursing note reviewed.  Constitutional:      Appearance: Normal appearance. She is normal weight.  HENT:     Head: Normocephalic and atraumatic.  Cardiovascular:     Rate and Rhythm: Normal rate and regular rhythm.     Pulses: Normal pulses.     Heart sounds: Normal heart sounds.  Abdominal:     General: Abdomen is flat. Bowel sounds are increased. There is no distension.     Tenderness:  There is no abdominal tenderness.  Skin:    General: Skin is warm and dry.  Neurological:     General: No focal deficit present.     Mental Status: She is alert and oriented to person, place, and time. Mental status is at baseline.  Psychiatric:        Mood and Affect: Mood normal.        Behavior: Behavior normal.        Thought Content: Thought content normal.        Judgment: Judgment normal.     Assessment & Plan:  Diarrhea of presumed infectious origin Assessment & Plan: No red flags on exam. CMP and stool studies ordered. Encouraged to push electrolyte fluids and increase PO intake as tolerated. Seek medical care if symptoms worsen or persist.   Orders: -     Clostridium difficile culture-fecal -     Gastrointestinal Pathogen Pnl RT, PCR -     Ova and parasite examination -     Comprehensive metabolic panel with GFR     Follow up plan: Return  if symptoms worsen or fail to improve.  Jenelle Mis, FNP

## 2024-04-14 ENCOUNTER — Ambulatory Visit: Payer: Self-pay

## 2024-04-14 ENCOUNTER — Other Ambulatory Visit

## 2024-04-14 ENCOUNTER — Telehealth: Payer: Self-pay | Admitting: Family Medicine

## 2024-04-14 DIAGNOSIS — I10 Essential (primary) hypertension: Secondary | ICD-10-CM

## 2024-04-14 DIAGNOSIS — E7800 Pure hypercholesterolemia, unspecified: Secondary | ICD-10-CM

## 2024-04-14 DIAGNOSIS — R197 Diarrhea, unspecified: Secondary | ICD-10-CM | POA: Diagnosis not present

## 2024-04-14 DIAGNOSIS — E039 Hypothyroidism, unspecified: Secondary | ICD-10-CM

## 2024-04-14 DIAGNOSIS — R7303 Prediabetes: Secondary | ICD-10-CM

## 2024-04-14 NOTE — Telephone Encounter (Signed)
 See triage encounter.

## 2024-04-14 NOTE — Telephone Encounter (Signed)
  Chief Complaint: Does she need lab work Symptoms: No triage Frequency: No triage Pertinent Negatives: Patient denies new symtoms Disposition: [] ED /[] Urgent Care (no appt availability in office) / [] Appointment(In office/virtual)/ []  [place] [facility]/ [] Home Care/ [] Refused Recommended Disposition /[] [place] [place] [facility]/ []  Follow-up with PCP Additional Notes:  Spoke with spouse he is inquiring if Dr. Cheril Cork would like lab work done, she is still experiencing symptoms. Lab orders noted in, scheduled today for lab visit [facility] 245pm.    Copied from telephone encounter:   Reason for Disposition  [1] Follow-up call from patient regarding patient's clinical status AND [2] information NON-URGENT  Protocols used: PCP Call - No Triage-A-AH

## 2024-04-14 NOTE — Telephone Encounter (Signed)
 Copied from CRM [id number]. Topic: Clinical - Medical Advice >> Apr 14, 2024  1:47 PM Leory Rands wrote: Reason for CRM: Patient's husband is calling in to report that she was in the office yesterday - reporting that the patient is not feeling much better- still reporting headache and diarrhea. Wanting to know is Dr. Cheril Cork still wanting more lab work? Please advise

## 2024-04-15 LAB — COMPREHENSIVE METABOLIC PANEL WITH GFR
AG Ratio: 1.2 (calc) (ref 1.0–2.5)
ALT: 22 U/L (ref 6–29)
AST: 27 U/L (ref 10–35)
Albumin: 3.7 g/dL (ref 3.6–5.1)
Alkaline phosphatase (APISO): 68 U/L (ref 37–153)
BUN: 9 mg/dL (ref 7–25)
CO2: 27 mmol/L (ref 20–32)
Calcium: 8.8 mg/dL (ref 8.6–10.4)
Chloride: 102 mmol/L (ref 98–110)
Creat: 0.82 mg/dL (ref 0.60–1.00)
Globulin: 3 g/dL (ref 1.9–3.7)
Glucose, Bld: 123 mg/dL — ABNORMAL HIGH (ref 65–99)
Potassium: 3.6 mmol/L (ref 3.5–5.3)
Sodium: 138 mmol/L (ref 135–146)
Total Bilirubin: 0.3 mg/dL (ref 0.2–1.2)
Total Protein: 6.7 g/dL (ref 6.1–8.1)
eGFR: 74 mL/min/{1.73_m2} (ref >=60)

## 2024-04-18 ENCOUNTER — Other Ambulatory Visit: Payer: Self-pay

## 2024-04-18 DIAGNOSIS — I10 Essential (primary) hypertension: Secondary | ICD-10-CM

## 2024-04-18 LAB — CLOSTRIDIUM DIFFICILE CULTURE-FECAL

## 2024-04-18 NOTE — Telephone Encounter (Signed)
 Prescription Request  04/18/2024  LOV:12/04/23 UPCOMING F/U APPT 06/03/24  What is the name of the medication or equipment? benazepril  (LOTENSIN ) 20 MG tablet [[id number]]   Have you contacted your pharmacy to request a refill? Yes   Which pharmacy would you like this sent to?  [facility] - [place], [place] - [address] [facility] AT Portal to Registered [facility] [address] [place] [address] Phone: [phone] Fax: [phone]    Patient notified that their request is being sent to the clinical staff for review and that they should receive a response within 2 business days.   Please advise at [facility] [phone]

## 2024-04-19 LAB — OVA AND PARASITE EXAMINATION
CONCENTRATE RESULT:: NONE SEEN
MICRO NUMBER:: 16337563
SPECIMEN QUALITY:: ADEQUATE
TRICHROME RESULT:: NONE SEEN

## 2024-04-19 LAB — GASTROINTESTINAL PATHOGEN PNL

## 2024-04-19 MED ORDER — BENAZEPRIL HCL 20 MG PO TABS
20.0000 mg | ORAL_TABLET | Freq: Every day | ORAL | 0 refills | Status: DC
Start: 2024-04-19 — End: 2024-08-09

## 2024-04-19 NOTE — Telephone Encounter (Signed)
 Requested Prescriptions  Pending Prescriptions Disp Refills   benazepril  (LOTENSIN ) 20 MG tablet 90 tablet 0    Sig: Take 1 tablet (20 mg total) by mouth daily.     Cardiovascular:  ACE Inhibitors Failed - 04/19/2024  9:16 AM      Failed - Last BP in normal range    BP Readings from Last 1 Encounters:  04/13/24 (!) 140/68         Failed - Valid encounter within last 6 months    Recent Outpatient Visits           6 days ago Diarrhea of presumed infectious origin   [place] [facility] Jenelle Mis, FNP   4 months ago Hypothyroidism, unspecified type   [place] [facility] Austine Lefort, MD   11 months ago Hypothyroidism, unspecified type   [place] [facility] Cheril Cork, Cisco Crest, MD   1 year ago Viral upper respiratory tract infection   [place] [facility] Cheril Cork, Cisco Crest, MD   1 year ago Viral upper respiratory tract infection   [place] [facility] Cheril Cork, Cisco Crest, MD              Passed - Cr in normal range and within 180 days    Creat  Date Value Ref Range Status  04/14/2024 0.82 0.60 - 1.00 mg/dL Final   Creatinine, Urine  Date Value Ref Range Status  11/10/2022 174 20 - 275 mg/dL Final         Passed - K in normal range and within 180 days    Potassium  Date Value Ref Range Status  04/14/2024 3.6 3.5 - 5.3 mmol/L Final         Passed - Patient is not pregnant

## 2024-04-22 ENCOUNTER — Other Ambulatory Visit: Payer: Self-pay | Admitting: Family Medicine

## 2024-04-22 NOTE — Telephone Encounter (Signed)
 Requested Prescriptions  Pending Prescriptions Disp Refills   rosuvastatin  (CRESTOR ) 10 MG tablet [Pharmacy Med Name: ROSUVASTATIN  CALCIUM  10 MG TAB] 90 tablet 1    Sig: TAKE 1 TABLET BY MOUTH EVERY DAY     Cardiovascular:  Antilipid - Statins 2 Failed - 04/22/2024  1:53 PM      Failed - Lipid Panel in normal range within the last 12 months    Cholesterol  Date Value Ref Range Status  12/01/2023 139 <200 mg/dL Final   LDL Cholesterol (Calc)  Date Value Ref Range Status  12/01/2023 71 mg/dL (calc) Final    Comment:    Reference range: <100 . Desirable range <100 mg/dL for primary prevention;   <70 mg/dL for patients with CHD or diabetic patients  with > or = 2 CHD risk factors. Aaron Aas LDL-C is now calculated using the Martin-Hopkins  calculation, which is a validated novel method providing  better accuracy than the Friedewald equation in the  estimation of LDL-C.  Melinda Sprawls et al. Erroll Heard. 1610;960(45): 2061-2068  (http://education.[employer].com/faq/FAQ164)    HDL  Date Value Ref Range Status  12/01/2023 54 > OR = 50 mg/dL Final   Triglycerides  Date Value Ref Range Status  12/01/2023 59 <150 mg/dL Final         Passed - Cr in normal range and within 360 days    Creat  Date Value Ref Range Status  04/14/2024 0.82 0.60 - 1.00 mg/dL Final   Creatinine, Urine  Date Value Ref Range Status  11/10/2022 174 20 - 275 mg/dL Final         Passed - Patient is not pregnant      Passed - Valid encounter within last 12 months    Recent Outpatient Visits           1 week ago Diarrhea of presumed infectious origin   [place] [facility] Jenelle Mis, FNP   4 months ago Hypothyroidism, unspecified type   [place] [facility] Austine Lefort, MD   11 months ago Hypothyroidism, unspecified type   [place] [facility] Austine Lefort, MD   1 year ago Viral upper respiratory tract infection   [place] [facility] Austine Lefort, MD   1 year ago Viral upper respiratory tract infection   [place] [facility] Pickard, Cisco Crest, MD

## 2024-04-26 DIAGNOSIS — H5203 Hypermetropia, bilateral: Secondary | ICD-10-CM | POA: Diagnosis not present

## 2024-05-27 ENCOUNTER — Ambulatory Visit: Payer: Self-pay

## 2024-05-27 NOTE — Telephone Encounter (Signed)
 Summary: sinus concern / rx req   Copied From CRM [id number]. Reason for Triage: The patient has experienced sinus discomfort for roughly 1 week. The patient shares that they have congestion and a cough. The patient would like to be prescribed something for their symptoms. Please contact further if/when possible       Chief Complaint: congestion, cough, runny nose/congested, white/brown sputum Symptoms: see above Frequency: since last week Pertinent Negatives: Patient denies fever, cp, sob Disposition: [] ED /[x] Urgent Care (no appt availability in office) / [] Appointment(In office/virtual)/ []  [facility]/ [] Home Care/ [] Refused Recommended Disposition /[] [facility]/ []  Follow-up with PCP Additional Notes: no apt available; instructed to go to the UC.  Pcp office updated. Care advice given, denies questions; instructed to go to ER if becomes worse.   Reason for Disposition  SEVERE coughing spells (e.g., whooping sound after coughing, vomiting after coughing)  Answer Assessment - Initial Assessment Questions 1. ONSET: "When did the cough begin?"      Last week 2. SEVERITY: "How bad is the cough today?"      moderate 3. SPUTUM: "Describe the color of your sputum" (none, dry cough; clear, white, yellow, green)     Thick white 4. HEMOPTYSIS: "Are you coughing up any blood?" If so ask: "How much?" (flecks, streaks, tablespoons, etc.)     no 5. DIFFICULTY BREATHING: "Are you having difficulty breathing?" If Yes, ask: "How bad is it?" (e.g., mild, moderate, severe)    - MILD: No SOB at rest, mild SOB with walking, speaks normally in sentences, can lie down, no retractions, pulse < 100.    - MODERATE: SOB at rest, SOB with minimal exertion and prefers to sit, cannot lie down flat, speaks in phrases, mild retractions, audible wheezing, pulse 100-120.    - SEVERE: Very SOB at rest, speaks in single words, struggling to breathe, sitting hunched forward, retractions, pulse > 120       no 6. FEVER: "Do you have a fever?" If Yes, ask: "What is your temperature, how was it measured, and when did it start?"     no 7. CARDIAC HISTORY: "Do you have any history of heart disease?" (e.g., heart attack, congestive heart failure)      no 8. LUNG HISTORY: "Do you have any history of lung disease?"  (e.g., pulmonary embolus, asthma, emphysema)     no 9. PE RISK FACTORS: "Do you have a history of blood clots?" (or: recent major surgery, recent prolonged travel, bedridden)     no 10. OTHER SYMPTOMS: "Do you have any other symptoms?" (e.g., runny nose, wheezing, chest pain)       Runny nose 11. PREGNANCY: "Is there any chance you are pregnant?" "When was your last menstrual period?"       na 12. TRAVEL: "Have you traveled out of the country in the last month?" (e.g., travel history, exposures)       no  Protocols used: Cough - Acute Productive-A-AH

## 2024-05-30 ENCOUNTER — Other Ambulatory Visit: Payer: Medicare HMO

## 2024-06-03 ENCOUNTER — Ambulatory Visit: Payer: Medicare HMO | Admitting: Family Medicine

## 2024-06-04 ENCOUNTER — Ambulatory Visit
Admission: RE | Admit: 2024-06-04 | Discharge: 2024-06-04 | Disposition: A | Discharge status: Home / Self Care | Source: Ambulatory Visit | Attending: Nurse Practitioner | Admitting: Nurse Practitioner

## 2024-06-04 VITALS — BP 160/72 | HR 76 | Temp 98.7°F | Resp 18

## 2024-06-04 DIAGNOSIS — J069 Acute upper respiratory infection, unspecified: Secondary | ICD-10-CM | POA: Diagnosis not present

## 2024-06-04 DIAGNOSIS — R059 Cough, unspecified: Secondary | ICD-10-CM | POA: Diagnosis not present

## 2024-06-04 MED ORDER — AZITHROMYCIN 250 MG PO TABS: 250.0000 mg | ORAL_TABLET | Freq: Every day | ORAL | 0 refills | Status: AC

## 2024-06-04 MED ORDER — PROMETHAZINE-DM 6.25-15 MG/5ML PO SYRP: 5.0000 mL | ORAL_SOLUTION | Freq: Every evening | ORAL | 0 refills | Status: AC | PRN

## 2024-06-04 NOTE — ED Provider Notes (Signed)
 [facility]    CSN: [id number] Arrival date & time: 06/04/24  1041      History   Chief Complaint No chief complaint on file.   HPI Marilyn [name] is a 78 y.o. female.   The history is provided by the patient.   Patient presents with an 11-day history of chest congestion, nasal congestion, and cough.  Patient denies fever, chills, headache, ear pain, ear drainage, wheezing, difficulty breathing, abdominal pain, nausea, vomiting, diarrhea, or rash.  Patient denies any obvious known sick contacts.  States that her symptoms get better, and then the next day she feels worse.  Patient states she has been taking Xyzal  and using Flonase along with Tylenol  for her symptoms.  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      Home Medications    Prior to Admission medications   Medication Sig Start Date End Date Taking? Authorizing Provider  Ascorbic Acid (VITAMIN C) 100 MG tablet Take 100 mg daily by mouth.    [provider]  aspirin 81 MG tablet Take 81 mg by mouth daily.    [provider]  benazepril  (LOTENSIN ) 20 MG tablet Take 1 tablet (20 mg total) by mouth daily. 04/19/24   Austine Lefort, MD  calcium  carbonate (OS-CAL) 600 MG TABS Take 600 mg by mouth daily.    [provider]  Cholecalciferol (VITAMIN D) 2000 UNITS tablet Take 2,000 Units by mouth daily.     [provider]  Glucosamine-Chondroitin (COSAMIN DS PO) Take by mouth daily. Take 1 pill on odd days and 2 pills on even days    [provider]  levothyroxine  (SYNTHROID ) 75 MCG tablet TAKE 1 TABLET DAILY 07/10/23   Austine Lefort, MD  Multiple Vitamins-Minerals (CENTRUM SILVER 50+WOMEN PO)  05/18/23   [provider]  PFIZER COVID-19 VAC BIVALENT injection  08/26/22   [provider]  rosuvastatin  (CRESTOR ) 10 MG tablet TAKE 1 TABLET BY MOUTH EVERY DAY 04/22/24   Austine Lefort, MD    Family History Family History  Problem Relation Age of Onset   Colon cancer Neg Hx     Social History Social History   Tobacco Use   Smoking status: Former    Current packs/day: 0.00    Types: Cigarettes    Quit date: 02/24/1992    Years since quitting: 32.2   Smokeless tobacco: Never  Substance Use Topics   Alcohol use: No   Drug use: No     Allergies   Other   Review of Systems Review of Systems Per HPI  Physical Exam Triage Vital Signs ED Triage Vitals  Encounter Vitals Group     BP 06/04/24 1049 (!) 160/72     Systolic BP Percentile --      Diastolic BP Percentile --  Pulse Rate 06/04/24 1049 76     Resp 06/04/24 1049 18     Temp 06/04/24 1049 98.7 F (37.1 C)     Temp Source 06/04/24 1049 Oral     SpO2 06/04/24 1049 93 %     Weight --      Height --      Head Circumference --      Peak Flow --      Pain Score 06/04/24 1050 0     Pain Loc --      Pain Education --      Exclude from Growth Chart --    No data found.  Updated Vital Signs BP (!) 160/72 (BP Location: Right Arm)   Pulse 76   Temp 98.7 F (37.1 C) (Oral)   Resp 18   SpO2 93%   Visual Acuity Right Eye Distance:   Left Eye Distance:   Bilateral Distance:    Right Eye Near:   Left Eye Near:    Bilateral Near:     Physical Exam Vitals and nursing note reviewed.  Constitutional:      General: She is not in acute distress.    Appearance: Normal  appearance.  HENT:     Head: Normocephalic.     Right Ear: Tympanic membrane, ear canal and external ear normal.     Left Ear: Tympanic membrane, ear canal and external ear normal.     Nose: Congestion present.     Right Turbinates: Enlarged and swollen.     Left Turbinates: Enlarged and swollen.     Right Sinus: No maxillary sinus tenderness or frontal sinus tenderness.     Left Sinus: No maxillary sinus tenderness or frontal sinus tenderness.     Mouth/Throat:     Lips: Pink.     Mouth: Mucous membranes are moist.     Pharynx: Postnasal drip present. No pharyngeal swelling, oropharyngeal exudate, posterior oropharyngeal erythema or uvula swelling.     Comments: Cobblestoning present to posterior oropharynx  Eyes:     Extraocular Movements: Extraocular movements intact.     Conjunctiva/sclera: Conjunctivae normal.     Pupils: Pupils are equal, round, and reactive to light.  Cardiovascular:     Rate and Rhythm: Normal rate and regular rhythm.     Pulses: Normal pulses.     Heart sounds: Normal heart sounds.  Pulmonary:     Effort: Pulmonary effort is normal. No respiratory distress.     Breath sounds: Normal breath sounds. No stridor. No wheezing, rhonchi or rales.  Abdominal:     General: Bowel sounds are normal.     Palpations: Abdomen is soft.     Tenderness: There is no abdominal tenderness.  Musculoskeletal:     Cervical back: Normal range of motion.  Skin:    General: Skin is warm and dry.  Neurological:     General: No focal deficit present.     Mental Status: She is alert and oriented to person, place, and time.  Psychiatric:        Mood and Affect: Mood normal.        Behavior: Behavior normal.      UC Treatments / Results  Labs (all labs ordered are listed, but only abnormal results are displayed) Labs Reviewed - No data to display  EKG   Radiology No results found.  Procedures Procedures (including critical care time)  Medications Ordered in  UC Medications - No data to display  Initial Impression / Assessment  and Plan / UC Course  I have reviewed the triage vital signs and the nursing notes.  Pertinent labs & imaging results that were available during my care of the patient were reviewed by me and considered in my medical decision making (see chart for details).  Patient with upper respiratory symptoms have been present for the past 11 days.  Symptoms have been waxing and waning per the patient's report.  Will provide empiric treatment with azithromycin  250 mg to cover for acute upper respiratory infection.  Promethazine  DM prescribed for cough for bedtime.  Patient advised to continue her current allergy regimen.  Supportive care recommendations were provided and discussed with the patient to include fluids, rest, over-the-counter analgesics, use of normal saline nasal spray, and use of a humidifier at nighttime during sleep.  Patient was advised to follow-up with PCP or in this clinic if symptoms fail to improve.  Patient was in agreement with this plan of care and verbalizes understanding.  All questions were answered.  Patient stable for discharge.   Final Clinical Impressions(s) / UC Diagnoses   Final diagnoses:  None   Discharge Instructions   None    ED Prescriptions   None    PDMP not reviewed this encounter.   Hardy Lia, NP 06/04/24 1102

## 2024-06-04 NOTE — Discharge Instructions (Signed)
 Take medication as prescribed.  Continue your current allergy medications. Increase fluids allow for plenty of rest. You may take over-the-counter Tylenol  as needed for pain, fever, or general discomfort. Recommend normal saline nasal spray throughout the day for nasal congestion and runny nose. For your cough, recommend the use of a humidifier in your bedroom at nighttime during sleep and sleeping elevated on pillows while symptoms persist. If symptoms fail to improve with this treatment, you may follow-up in this clinic or with your primary care physician for further evaluation. Follow-up as needed.

## 2024-06-04 NOTE — ED Triage Notes (Signed)
 Pt reports she has been coughing and chest congestion x 11 days

## 2024-06-10 DIAGNOSIS — Z01419 Encounter for gynecological examination (general) (routine) without abnormal findings: Secondary | ICD-10-CM | POA: Diagnosis not present

## 2024-06-10 DIAGNOSIS — Z1231 Encounter for screening mammogram for malignant neoplasm of breast: Secondary | ICD-10-CM | POA: Diagnosis not present

## 2024-06-10 DIAGNOSIS — Z6821 Body mass index (BMI) 21.0-21.9, adult: Secondary | ICD-10-CM | POA: Diagnosis not present

## 2024-06-10 LAB — HM MAMMOGRAPHY

## 2024-06-17 ENCOUNTER — Ambulatory Visit: Admitting: Family Medicine

## 2024-06-17 ENCOUNTER — Encounter: Payer: Self-pay | Admitting: Family Medicine

## 2024-06-17 VITALS — BP 133/70 | HR 71 | Temp 98.2°F | Ht 66.0 in | Wt 137.2 lb

## 2024-06-17 DIAGNOSIS — R7303 Prediabetes: Secondary | ICD-10-CM

## 2024-06-17 DIAGNOSIS — E039 Hypothyroidism, unspecified: Secondary | ICD-10-CM | POA: Diagnosis not present

## 2024-06-17 NOTE — Progress Notes (Signed)
 Subjective:    Patient ID: Marilyn [name], female    DOB: Mar 07, 1946, 78 y.o.   MRN: [id number]  Patient is a very pleasant 78 year old Caucasian female here today for checkup.  We have been monitoring the patient for mild memory loss that she mentioned about 1 year ago.  Patient denies that she is seeing any deterioration in her memory.  She denies forgetting any recent conversations.  She denies losing money.  She denies getting lost while driving.  She denies forgetting to pay bills.  She denies any concerns that other family members may have.  She does report a cough for the last 3 weeks.  She was at an urgent care where they gave her antibiotics which did not seem to help.  She reports drainage and postnasal drip.  Xyzal  has helped some but the cough primarily occurs at night when she is lying down.  She denies any acid reflux.  She is on benazepril .   Past Medical History:  Diagnosis Date   Bulging disc    Hypertension    Osteoporosis    Rosacea    Thyroid  disease    Past Surgical History:  Procedure Laterality Date   BILATERAL TEMPOROMANDIBULAR JOINT ARTHROPLASTY     TUBAL LIGATION     Current Outpatient Medications on File Prior to Visit  Medication Sig Dispense Refill   Apoaequorin (PREVAGEN) 10 MG CAPS Take 10 mg by mouth daily.     Ascorbic Acid (VITAMIN C) 100 MG tablet Take 100 mg daily by mouth.     aspirin 81 MG tablet Take 81 mg by mouth daily.     benazepril  (LOTENSIN ) 20 MG tablet Take 1 tablet (20 mg total) by mouth daily. 90 tablet 0   calcium  carbonate (OS-CAL) 600 MG TABS Take 600 mg by mouth daily.     Cholecalciferol (VITAMIN D) 2000 UNITS tablet Take 2,000 Units by mouth daily.     Glucosamine-Chondroitin (COSAMIN DS PO) Take by mouth daily. Take 1 pill on odd days and 2 pills on even days     levothyroxine  (SYNTHROID ) 75 MCG tablet TAKE 1 TABLET DAILY 90 tablet 3   Multiple Vitamins-Minerals (CENTRUM SILVER 50+WOMEN PO)      PFIZER COVID-19 VAC BIVALENT  injection      promethazine -dextromethorphan (PROMETHAZINE -DM) 6.25-15 MG/5ML syrup Take 5 mLs by mouth at bedtime as needed. 118 mL 0   rosuvastatin  (CRESTOR ) 10 MG tablet TAKE 1 TABLET BY MOUTH EVERY DAY 90 tablet 1   azithromycin  (ZITHROMAX ) 250 MG tablet Take 1 tablet (250 mg total) by mouth daily. Take first 2 tablets together, then 1 every day until finished. (Patient not taking: Reported on 06/17/2024) 6 tablet 0   No current facility-administered medications on file prior to visit.   Allergies  Allergen Reactions   Other Diarrhea   Social History   Socioeconomic History   Marital status: Married    Spouse name: Not on file   Number of children: Not on file   Years of education: Not on file   Highest education level: 12th grade  Occupational History   Not on file  Tobacco Use   Smoking status: Former    Current packs/day: 0.00    Types: Cigarettes    Quit date: 02/24/1992    Years since quitting: 32.3   Smokeless tobacco: Never  Vaping Use   Vaping status: Never Used  Substance and Sexual Activity   Alcohol use: No   Drug use: No   Sexual  activity: Not on file  Other Topics Concern   Not on file  Social History Narrative   Not on file   Social Drivers of Health   Financial Resource Strain: Low Risk  (06/15/2024)   Overall Financial Resource Strain (CARDIA)    Difficulty of Paying Living Expenses: Not hard at all  Food Insecurity: No Food Insecurity (06/15/2024)   Hunger Vital Sign    Worried About Running Out of Food in the Last Year: Never true    Ran Out of Food in the Last Year: Never true  Transportation Needs: No Transportation Needs (06/15/2024)   PRAPARE - Administrator, Civil Service (Medical): No    Lack of Transportation (Non-Medical): No  Physical Activity: Unknown (06/15/2024)   Exercise Vital Sign    Days of Exercise per Week: Patient declined    Minutes of Exercise per Session: Not on file  Stress: No Stress Concern Present  (06/15/2024)   [employer] of Occupational Health - Occupational Stress Questionnaire    Feeling of Stress: Only a little  Social Connections: Socially Integrated (06/15/2024)   Social Connection and Isolation Panel    Frequency of Communication with Friends and Family: More than three times a week    Frequency of Social Gatherings with Friends and Family: Once a week    Attends Religious Services: More than 4 times per year    Active Member of [employer] or Organizations: Yes    Attends Banker Meetings: More than 4 times per year    Marital Status: Married  Catering manager Violence: Not At Risk (04/23/2023)   Humiliation, Afraid, Rape, and Kick questionnaire    Fear of Current or Ex-Partner: No    Emotionally Abused: No    Physically Abused: No    Sexually Abused: No      Review of Systems  Gastrointestinal:  Positive for diarrhea.  All other systems reviewed and are negative.      Objective:   Physical Exam Vitals reviewed.   Cardiovascular:     Rate and Rhythm: Normal rate and regular rhythm.     Heart sounds: Normal heart sounds.  Pulmonary:     Effort: Pulmonary effort is normal. No respiratory distress.     Breath sounds: Normal breath sounds. No wheezing or rales.  Abdominal:     General: Abdomen is flat. Bowel sounds are normal. There is no distension.     Palpations: Abdomen is soft. There is no mass.     Tenderness: There is no abdominal tenderness. There is no right CVA tenderness, left CVA tenderness, guarding or rebound.     Hernia: No hernia is present.   Musculoskeletal:     Cervical [name]: Neck supple.  Lymphadenopathy:     Cervical: No cervical adenopathy.           Assessment & Plan:  Prediabetes - Plan: CBC with Differential/Platelet, Comprehensive metabolic panel with GFR, Lipid panel, TSH, Hemoglobin A1c I believe the patient's cough is due to postnasal drip.  I recommended adding Flonase daily to her Xyzal  to see if the cough  will improve.  Blood pressure today is excellent.  I recommended monitoring a hemoglobin A1c to ensure that her prediabetes is stable.  I would like to check a fasting lipid panel with a goal LDL less than 100.  I will check a TSH to ensure that her levothyroxine  is dosed appropriately.  Based on her review of systems, I see no indication that she has  progressive memory loss and therefore I see no indication of Alzheimer's disease.

## 2024-06-18 LAB — CBC WITH DIFFERENTIAL/PLATELET
Absolute Lymphocytes: 2147 {cells}/uL (ref 850–3900)
Absolute Monocytes: 915 {cells}/uL (ref 200–950)
Basophils Absolute: 34 {cells}/uL (ref 0–200)
Basophils Relative: 0.3 %
Eosinophils Absolute: 136 {cells}/uL (ref 15–500)
Eosinophils Relative: 1.2 %
HCT: 37.6 % (ref 35.0–45.0)
Hemoglobin: 12.3 g/dL (ref 11.7–15.5)
MCH: 31 pg (ref 27.0–33.0)
MCHC: 32.7 g/dL (ref 32.0–36.0)
MCV: 94.7 fL (ref 80.0–100.0)
MPV: 10.2 fL (ref 7.5–12.5)
Monocytes Relative: 8.1 %
Neutro Abs: 8068 {cells}/uL — ABNORMAL HIGH (ref 1500–7800)
Neutrophils Relative %: 71.4 %
Platelets: 333 10*3/uL (ref 140–400)
RBC: 3.97 10*6/uL (ref 3.80–5.10)
RDW: 12.2 % (ref 11.0–15.0)
Total Lymphocyte: 19 %
WBC: 11.3 10*3/uL — ABNORMAL HIGH (ref 3.8–10.8)

## 2024-06-18 LAB — COMPREHENSIVE METABOLIC PANEL WITH GFR
AG Ratio: 1.5 (calc) (ref 1.0–2.5)
ALT: 19 U/L (ref 6–29)
AST: 23 U/L (ref 10–35)
Albumin: 4.2 g/dL (ref 3.6–5.1)
Alkaline phosphatase (APISO): 65 U/L (ref 37–153)
BUN: 23 mg/dL (ref 7–25)
CO2: 25 mmol/L (ref 20–32)
Calcium: 9.4 mg/dL (ref 8.6–10.4)
Chloride: 102 mmol/L (ref 98–110)
Creat: 0.85 mg/dL (ref 0.60–1.00)
Globulin: 2.8 g/dL (ref 1.9–3.7)
Glucose, Bld: 88 mg/dL (ref 65–99)
Potassium: 4.3 mmol/L (ref 3.5–5.3)
Sodium: 138 mmol/L (ref 135–146)
Total Bilirubin: 0.4 mg/dL (ref 0.2–1.2)
Total Protein: 7 g/dL (ref 6.1–8.1)
eGFR: 71 mL/min/{1.73_m2} (ref >=60)

## 2024-06-18 LAB — TSH: TSH: 0.55 m[IU]/L (ref 0.40–4.50)

## 2024-06-18 LAB — HEMOGLOBIN A1C
Hgb A1c MFr Bld: 6.3 % — ABNORMAL HIGH (ref <5.7)
Mean Plasma Glucose: 134 mg/dL
eAG (mmol/L): 7.4 mmol/L

## 2024-06-18 LAB — LIPID PANEL
Cholesterol: 142 mg/dL (ref <200)
HDL: 53 mg/dL (ref >=50)
LDL Cholesterol (Calc): 75 mg/dL
Non-HDL Cholesterol (Calc): 89 mg/dL (ref <130)
Total CHOL/HDL Ratio: 2.7 (calc) (ref <5.0)
Triglycerides: 65 mg/dL (ref <150)

## 2024-06-20 ENCOUNTER — Ambulatory Visit: Payer: Self-pay | Admitting: Family Medicine

## 2024-07-01 ENCOUNTER — Other Ambulatory Visit: Payer: Self-pay | Admitting: Family Medicine

## 2024-07-01 DIAGNOSIS — E039 Hypothyroidism, unspecified: Secondary | ICD-10-CM

## 2024-07-19 ENCOUNTER — Telehealth: Payer: Self-pay

## 2024-07-19 ENCOUNTER — Ambulatory Visit (INDEPENDENT_AMBULATORY_CARE_PROVIDER_SITE_OTHER): Admitting: Family Medicine

## 2024-07-19 VITALS — BP 122/82 | HR 65 | Temp 97.8°F | Ht 66.0 in | Wt 136.4 lb

## 2024-07-19 DIAGNOSIS — Z0001 Encounter for general adult medical examination with abnormal findings: Secondary | ICD-10-CM

## 2024-07-19 DIAGNOSIS — D72829 Elevated white blood cell count, unspecified: Secondary | ICD-10-CM

## 2024-07-19 DIAGNOSIS — R7303 Prediabetes: Secondary | ICD-10-CM | POA: Diagnosis not present

## 2024-07-19 DIAGNOSIS — Z0000 Encounter for general adult medical examination without abnormal findings: Secondary | ICD-10-CM

## 2024-07-19 DIAGNOSIS — M810 Age-related osteoporosis without current pathological fracture: Secondary | ICD-10-CM

## 2024-07-19 DIAGNOSIS — E039 Hypothyroidism, unspecified: Secondary | ICD-10-CM

## 2024-07-19 LAB — CBC WITH DIFFERENTIAL/PLATELET
Absolute Lymphocytes: 1779 {cells}/uL (ref 850–3900)
Absolute Monocytes: 693 {cells}/uL (ref 200–950)
Basophils Absolute: 39 {cells}/uL (ref 0–200)
Basophils Relative: 0.5 %
Eosinophils Absolute: 131 {cells}/uL (ref 15–500)
Eosinophils Relative: 1.7 %
HCT: 38.9 % (ref 35.0–45.0)
Hemoglobin: 12.4 g/dL (ref 11.7–15.5)
MCH: 30.4 pg (ref 27.0–33.0)
MCHC: 31.9 g/dL — ABNORMAL LOW (ref 32.0–36.0)
MCV: 95.3 fL (ref 80.0–100.0)
MPV: 10.4 fL (ref 7.5–12.5)
Monocytes Relative: 9 %
Neutro Abs: 5059 {cells}/uL (ref 1500–7800)
Neutrophils Relative %: 65.7 %
Platelets: 289 Thousand/uL (ref 140–400)
RBC: 4.08 Million/uL (ref 3.80–5.10)
RDW: 11.9 % (ref 11.0–15.0)
Total Lymphocyte: 23.1 %
WBC: 7.7 Thousand/uL (ref 3.8–10.8)

## 2024-07-19 NOTE — Telephone Encounter (Signed)
 Copied from CRM [id number]. Topic: General - Other >> Jul 19, 2024 11:34 AM [name] wrote: Reason for CRM: Received all from patient, states she had appointment today with provider, 07/19/24, and is calling back to confirm last bone density was in 2020, and is due for another one this year.  Patient requesting to have that appointment scheduled.   Patient requesting this to be scheduled, can be reached with appointment at [phone].

## 2024-07-19 NOTE — Progress Notes (Signed)
 Subjective:    Patient ID: Marilyn [name] Her, female    DOB: 29-Sep-1946, 78 y.o.   MRN: [id number]  Patient is a very pleasant 78 year old Caucasian female here today for checkup.  Patient's last bone density test was in 2020.  This showed osteoporosis in spine.  This is due now.  Patient is currently not on treatment for osteoporosis.  Patient states she had her mammogram in March at her gynecologist and this was normal.  She does not require a Pap smear.  Last Cologuard was in 2023 and is due again in 2026.  Patient has had her pneumonia vaccine.  She is due for shingles vaccine, RSV, and a flu and a COVID shot in the fall.  Most recent lab work was significant only for leukocytosis and prediabetes with a hemoglobin A1c of 6.3. No visits with results within 1 Month(s) from this visit.  Latest known visit with results is:  Office Visit on 06/17/2024  Component Date Value Ref Range Status   WBC 06/17/2024 11.3 (H)  3.8 - 10.8 Thousand/uL Final   RBC 06/17/2024 3.97  3.80 - 5.10 Million/uL Final   Hemoglobin 06/17/2024 12.3  11.7 - 15.5 g/dL Final   HCT 93/79/7974 37.6  35.0 - 45.0 % Final   MCV 06/17/2024 94.7  80.0 - 100.0 fL Final   MCH 06/17/2024 31.0  27.0 - 33.0 pg Final   MCHC 06/17/2024 32.7  32.0 - 36.0 g/dL Final   Comment: For adults, a slight decrease in the calculated MCHC value (in the range of 30 to 32 g/dL) is most likely not clinically significant; however, it should be interpreted with caution in correlation with other red cell parameters and the patient's clinical condition.    RDW 06/17/2024 12.2  11.0 - 15.0 % Final   Platelets 06/17/2024 333  140 - 400 Thousand/uL Final   MPV 06/17/2024 10.2  7.5 - 12.5 fL Final   Neutro Abs 06/17/2024 8,068 (H)  1,500 - 7,800 cells/uL Final   Absolute Lymphocytes 06/17/2024 2,147  850 - 3,900 cells/uL Final   Absolute Monocytes 06/17/2024 915  200 - 950 cells/uL Final   Eosinophils Absolute 06/17/2024 136  15 - 500 cells/uL Final    Basophils Absolute 06/17/2024 34  0 - 200 cells/uL Final   Neutrophils Relative % 06/17/2024 71.4  % Final   Total Lymphocyte 06/17/2024 19.0  % Final   Monocytes Relative 06/17/2024 8.1  % Final   Eosinophils Relative 06/17/2024 1.2  % Final   Basophils Relative 06/17/2024 0.3  % Final   Glucose, Bld 06/17/2024 88  65 - 99 mg/dL Final   Comment: .            Fasting reference interval .    BUN 06/17/2024 23  7 - 25 mg/dL Final   Creat 93/79/7974 0.85  0.60 - 1.00 mg/dL Final   eGFR 93/79/7974 71  > OR = 60 mL/min/1.71m2 Final   BUN/Creatinine Ratio 06/17/2024 SEE NOTE:  6 - 22 (calc) Final   Comment:    Not Reported: BUN and Creatinine are within    reference range. .    Sodium 06/17/2024 138  135 - 146 mmol/L Final   Potassium 06/17/2024 4.3  3.5 - 5.3 mmol/L Final   Chloride 06/17/2024 102  98 - 110 mmol/L Final   CO2 06/17/2024 25  20 - 32 mmol/L Final   Calcium  06/17/2024 9.4  8.6 - 10.4 mg/dL Final   Total Protein 93/79/7974 7.0  6.1 -  8.1 g/dL Final   Albumin 93/79/7974 4.2  3.6 - 5.1 g/dL Final   Globulin 93/79/7974 2.8  1.9 - 3.7 g/dL (calc) Final   AG Ratio 06/17/2024 1.5  1.0 - 2.5 (calc) Final   Total Bilirubin 06/17/2024 0.4  0.2 - 1.2 mg/dL Final   Alkaline phosphatase (APISO) 06/17/2024 65  37 - 153 U/L Final   AST 06/17/2024 23  10 - 35 U/L Final   ALT 06/17/2024 19  6 - 29 U/L Final   Cholesterol 06/17/2024 142  <200 mg/dL Final   HDL 93/79/7974 53  > OR = 50 mg/dL Final   Triglycerides 93/79/7974 65  <150 mg/dL Final   LDL Cholesterol (Calc) 06/17/2024 75  mg/dL (calc) Final   Comment: Reference range: <100 . Desirable range <100 mg/dL for primary prevention;   <70 mg/dL for patients with CHD or diabetic patients  with > or = 2 CHD risk factors. SABRA LDL-C is now calculated using the Martin-Hopkins  calculation, which is a validated novel method providing  better accuracy than the Friedewald equation in the  estimation of LDL-C.  Gladis APPLETHWAITE et al. SANDREA.  7986;689(80): 2061-2068  (http://education.[facility].com/faq/FAQ164)    Total CHOL/HDL Ratio 06/17/2024 2.7  <4.9 (calc) Final   Non-HDL Cholesterol (Calc) 06/17/2024 89  <130 mg/dL (calc) Final   Comment: For patients with diabetes plus 1 major ASCVD risk  factor, treating to a non-HDL-C goal of <100 mg/dL  (LDL-C of <29 mg/dL) is considered a therapeutic  option.    TSH 06/17/2024 0.55  0.40 - 4.50 mIU/L Final   Hgb A1c MFr Bld 06/17/2024 6.3 (H)  <5.7 % Final   Comment: For someone without known diabetes, a hemoglobin  A1c value between 5.7% and 6.4% is consistent with prediabetes and should be confirmed with a  follow-up test. . For someone with known diabetes, a value <7% indicates that their diabetes is well controlled. A1c targets should be individualized based on duration of diabetes, age, comorbid conditions, and other considerations. . This assay result is consistent with an increased risk of diabetes. . Currently, no consensus exists regarding use of hemoglobin A1c for diagnosis of diabetes for children. .    Mean Plasma Glucose 06/17/2024 134  mg/dL Final   eAG (mmol/L) 93/79/7974 7.4  mmol/L Final     Past Medical History:  Diagnosis Date   Bulging disc    Hypertension    Osteoporosis    Rosacea    Thyroid  disease    Past Surgical History:  Procedure Laterality Date   BILATERAL TEMPOROMANDIBULAR JOINT ARTHROPLASTY     TUBAL LIGATION     Current Outpatient Medications on File Prior to Visit  Medication Sig Dispense Refill   Apoaequorin (PREVAGEN) 10 MG CAPS Take 10 mg by mouth daily.     Ascorbic Acid (VITAMIN C) 100 MG tablet Take 100 mg daily by mouth.     aspirin 81 MG tablet Take 81 mg by mouth daily.     benazepril  (LOTENSIN ) 20 MG tablet Take 1 tablet (20 mg total) by mouth daily. 90 tablet 0   calcium  carbonate (OS-CAL) 600 MG TABS Take 600 mg by mouth daily.     Cholecalciferol (VITAMIN D) 2000 UNITS tablet Take 2,000 Units by mouth  daily.     Glucosamine-Chondroitin (COSAMIN DS PO) Take by mouth daily. Take 1 pill on odd days and 2 pills on even days     levothyroxine  (SYNTHROID ) 75 MCG tablet TAKE 1 TABLET DAILY 90 tablet 3  Multiple Vitamins-Minerals (CENTRUM SILVER 50+WOMEN PO)      PFIZER COVID-19 VAC BIVALENT injection      rosuvastatin  (CRESTOR ) 10 MG tablet TAKE 1 TABLET BY MOUTH EVERY DAY 90 tablet 1   azithromycin  (ZITHROMAX ) 250 MG tablet Take 1 tablet (250 mg total) by mouth daily. Take first 2 tablets together, then 1 every day until finished. (Patient not taking: Reported on 07/19/2024) 6 tablet 0   promethazine -dextromethorphan (PROMETHAZINE -DM) 6.25-15 MG/5ML syrup Take 5 mLs by mouth at bedtime as needed. (Patient not taking: Reported on 07/19/2024) 118 mL 0   No current facility-administered medications on file prior to visit.   Allergies  Allergen Reactions   Other Diarrhea   Social History   Socioeconomic History   Marital status: Married    Spouse name: Not on file   Number of children: Not on file   Years of education: Not on file   Highest education level: 12th grade  Occupational History   Not on file  Tobacco Use   Smoking status: Former    Current packs/day: 0.00    Types: Cigarettes    Quit date: 02/24/1992    Years since quitting: 32.4   Smokeless tobacco: Never  Vaping Use   Vaping status: Never Used  Substance and Sexual Activity   Alcohol use: No   Drug use: No   Sexual activity: Not on file  Other Topics Concern   Not on file  Social History Narrative   Not on file   Social Drivers of Health   Financial Resource Strain: Low Risk  (07/19/2024)   Overall Financial Resource Strain (CARDIA)    Difficulty of Paying Living Expenses: Not hard at all  Food Insecurity: No Food Insecurity (07/19/2024)   Hunger Vital Sign    Worried About Running Out of Food in the Last Year: Never true    Ran Out of Food in the Last Year: Never true  Transportation Needs: No Transportation  Needs (07/19/2024)   PRAPARE - Administrator, Civil Service (Medical): No    Lack of Transportation (Non-Medical): No  Physical Activity: Sufficiently Active (07/19/2024)   Exercise Vital Sign    Days of Exercise per Week: 3 days    Minutes of Exercise per Session: 60 min  Stress: No Stress Concern Present (07/19/2024)   [employer] of Occupational Health - Occupational Stress Questionnaire    Feeling of Stress: Not at all  Social Connections: Socially Integrated (07/19/2024)   Social Connection and Isolation Panel    Frequency of Communication with Friends and Family: More than three times a week    Frequency of Social Gatherings with Friends and Family: More than three times a week    Attends Religious Services: More than 4 times per year    Active Member of [employer] or Organizations: Yes    Attends Banker Meetings: More than 4 times per year    Marital Status: Married  Catering manager Violence: Not At Risk (07/19/2024)   Humiliation, Afraid, Rape, and Kick questionnaire    Fear of Current or Ex-Partner: No    Emotionally Abused: No    Physically Abused: No    Sexually Abused: No      Review of Systems  Gastrointestinal:  Positive for diarrhea.  All other systems reviewed and are negative.      Objective:   Physical Exam Vitals reviewed.  Cardiovascular:     Rate and Rhythm: Normal rate and regular rhythm.  Heart sounds: Normal heart sounds.  Pulmonary:     Effort: Pulmonary effort is normal. No respiratory distress.     Breath sounds: Normal breath sounds. No wheezing or rales.  Abdominal:     General: Abdomen is flat. Bowel sounds are normal. There is no distension.     Palpations: Abdomen is soft. There is no mass.     Tenderness: There is no abdominal tenderness. There is no right CVA tenderness, left CVA tenderness, guarding or rebound.     Hernia: No hernia is present.  Musculoskeletal:     Cervical back: Neck supple.   Lymphadenopathy:     Cervical: No cervical adenopathy.           Assessment & Plan:  Encounter for Medicare annual wellness exam  Leukocytosis, unspecified type - Plan: CBC with Differential/Platelet  Osteoporosis, unspecified osteoporosis type, unspecified pathological fracture presence - Plan: DG Bone Density  Prediabetes  Hypothyroidism, unspecified type Recommended the shingles shot, the RSV shot, and a flu and COVID shot in the fall.  Cologuard is due next year.  Mammogram is due in March 2026.  I will schedule the patient for lung density test at this confirmed osteoporosis I would recommend Fosamax.  Pap smear is no longer required.  Recommended low carbohydrate diet.  TSH is excellent.  Blood pressure is outstanding.  Cholesterol is excellent.  Repeat CBC to monitor leukocytosis.

## 2024-07-19 NOTE — Progress Notes (Signed)
 Subjective:   Marilyn [name] is a 78 y.o. female who presents for Medicare Annual (Subsequent) preventive examination.  Visit Complete: In person  Patient Medicare AWV questionnaire was completed by the patient on 07/19/2024; I have confirmed that all information answered by patient is correct and no changes since this date.        Objective:    There were no vitals filed for this visit. There is no height or weight on file to calculate BMI.     04/23/2023    4:15 PM  Advanced Directives  Does Patient Have a Medical Advance Directive? No  Would patient like information on creating a medical advance directive? No - Patient declined    Current Medications (verified) Outpatient Encounter Medications as of 07/19/2024  Medication Sig   Apoaequorin (PREVAGEN) 10 MG CAPS Take 10 mg by mouth daily.   Ascorbic Acid (VITAMIN C) 100 MG tablet Take 100 mg daily by mouth.   aspirin 81 MG tablet Take 81 mg by mouth daily.   azithromycin  (ZITHROMAX ) 250 MG tablet Take 1 tablet (250 mg total) by mouth daily. Take first 2 tablets together, then 1 every day until finished. (Patient not taking: Reported on 06/17/2024)   benazepril  (LOTENSIN ) 20 MG tablet Take 1 tablet (20 mg total) by mouth daily.   calcium  carbonate (OS-CAL) 600 MG TABS Take 600 mg by mouth daily.   Cholecalciferol (VITAMIN D) 2000 UNITS tablet Take 2,000 Units by mouth daily.   Glucosamine-Chondroitin (COSAMIN DS PO) Take by mouth daily. Take 1 pill on odd days and 2 pills on even days   levothyroxine  (SYNTHROID ) 75 MCG tablet TAKE 1 TABLET DAILY   Multiple Vitamins-Minerals (CENTRUM SILVER 50+WOMEN PO)    PFIZER COVID-19 VAC BIVALENT injection    promethazine -dextromethorphan (PROMETHAZINE -DM) 6.25-15 MG/5ML syrup Take 5 mLs by mouth at bedtime as needed.   rosuvastatin  (CRESTOR ) 10 MG tablet TAKE 1 TABLET BY MOUTH EVERY DAY   No facility-administered encounter medications on file as of 07/19/2024.    Allergies  (verified) Other   History: Past Medical History:  Diagnosis Date   Bulging disc    Hypertension    Osteoporosis    Rosacea    Thyroid  disease    Past Surgical History:  Procedure Laterality Date   BILATERAL TEMPOROMANDIBULAR JOINT ARTHROPLASTY     TUBAL LIGATION     Family History  Problem Relation Age of Onset   Colon cancer Neg Hx    Social History   Socioeconomic History   Marital status: Married    Spouse name: Not on file   Number of children: Not on file   Years of education: Not on file   Highest education level: 12th grade  Occupational History   Not on file  Tobacco Use   Smoking status: Former    Current packs/day: 0.00    Types: Cigarettes    Quit date: 02/24/1992    Years since quitting: 32.4   Smokeless tobacco: Never  Vaping Use   Vaping status: Never Used  Substance and Sexual Activity   Alcohol use: No   Drug use: No   Sexual activity: Not on file  Other Topics Concern   Not on file  Social History Narrative   Not on file   Social Drivers of Health   Financial Resource Strain: Low Risk  (06/15/2024)   Overall Financial Resource Strain (CARDIA)    Difficulty of Paying Living Expenses: Not hard at all  Food Insecurity: No Food Insecurity (  06/15/2024)   Hunger Vital Sign    Worried About Running Out of Food in the Last Year: Never true    Ran Out of Food in the Last Year: Never true  Transportation Needs: No Transportation Needs (06/15/2024)   PRAPARE - Administrator, Civil Service (Medical): No    Lack of Transportation (Non-Medical): No  Physical Activity: Unknown (06/15/2024)   Exercise Vital Sign    Days of Exercise per Week: Patient declined    Minutes of Exercise per Session: Not on file  Stress: No Stress Concern Present (06/15/2024)   [employer] of Occupational Health - Occupational Stress Questionnaire    Feeling of Stress: Only a little  Social Connections: Socially Integrated (06/15/2024)   Social Connection  and Isolation Panel    Frequency of Communication with Friends and Family: More than three times a week    Frequency of Social Gatherings with Friends and Family: Once a week    Attends Religious Services: More than 4 times per year    Active Member of [employer] or Organizations: Yes    Attends Engineer, structural: More than 4 times per year    Marital Status: Married    Tobacco Counseling Counseling given: Not Answered   Clinical Intake:                        Activities of Daily Living    07/14/2024    4:10 PM  In your present state of health, do you have any difficulty performing the following activities:  Hearing? 0  Vision? 0  Difficulty concentrating or making decisions? 0  Walking or climbing stairs? 0  Dressing or bathing? 0  Doing errands, shopping? 0  Preparing Food and eating ? N  Using the Toilet? N  In the past six months, have you accidently leaked urine? N  Do you have problems with loss of bowel control? N  Managing your Medications? N  Managing your Finances? N  Housekeeping or managing your Housekeeping? N    Patient Care Team: Duanne Butler DASEN, MD as PCP - General (Family Medicine) [facility], [facility]  Indicate any recent Medical Services you may have received from other than [facility] providers in the past year (date may be approximate).     Assessment:   This is a routine wellness examination for [place].  Hearing/Vision screen No results found.   Goals Addressed   None    Depression Screen    06/17/2024   11:27 AM 04/13/2024    8:34 AM 12/04/2023    8:58 AM 05/19/2023    9:00 AM 04/23/2023    4:06 PM 12/02/2022    2:28 PM 11/13/2022    2:10 PM  PHQ 2/9 Scores  PHQ - 2 Score 0 0 0 0 0 0 0  PHQ- 9 Score 3          Fall Risk    07/14/2024    4:10 PM 06/17/2024   11:27 AM 04/13/2024    8:34 AM 12/04/2023    8:58 AM 05/19/2023    9:00 AM  Fall Risk   Falls in the past year? 0 0 0 0 0  Number falls in past  yr:  0 0 0 0  Injury with Fall?  0 0 0 0  Risk for fall due to :  No Fall Risks  No Fall Risks No Fall Risks  Follow up  Falls evaluation completed Follow up  appointment Falls prevention discussed Falls prevention discussed    MEDICARE RISK AT HOME: Medicare Risk at Home Any stairs in or around the home?: (Patient-Rptd) No Home free of loose throw rugs in walkways, pet beds, electrical cords, etc?: (Patient-Rptd) Yes Adequate lighting in your home to reduce risk of falls?: (Patient-Rptd) Yes Life alert?: (Patient-Rptd) No Use of a cane, walker or w/c?: (Patient-Rptd) No Grab bars in the bathroom?: (Patient-Rptd) No Shower chair or bench in shower?: (Patient-Rptd) Yes Elevated toilet seat or a handicapped toilet?: (Patient-Rptd) No  TIMED UP AND GO:  Was the test performed?  Yes  Length of time to ambulate 10 feet: 6 sec Gait steady and fast without use of assistive device    Cognitive Function:        04/23/2023    4:14 PM  6CIT Screen  What Year? 0 points  What month? 0 points  What time? 0 points  Count back from 20 0 points  Months in reverse 0 points  Repeat phrase 0 points  Total Score 0 points    Immunizations Immunization History  Administered Date(s) Administered   Fluad Quad(high Dose 65+) 11/10/2022   Influenza, High Dose Seasonal PF 10/07/2017, 12/24/2020, 10/16/2023   Influenza,inj,Quad PF,6+ Mos 01/04/2014, 11/06/2015, 09/07/2018   Influenza-Unspecified 10/20/2016   PFIZER(Purple Top)SARS-COV-2 Vaccination 02/20/2020, 03/12/2020   Pfizer(Comirnaty)Fall Seasonal Vaccine 12 years and older 10/16/2023   Pneumococcal Conjugate-13 06/21/2015   Pneumococcal Polysaccharide-23 10/26/2012   Tdap 03/27/2011    TDAP status: Due, Education has been provided regarding the importance of this vaccine. Advised may receive this vaccine at local pharmacy or Health Dept. Aware to provide a copy of the vaccination record if obtained from local pharmacy or Health Dept.  Verbalized acceptance and understanding.  Flu Vaccine status: Up to date  Pneumococcal vaccine status: Up to date  Covid-19 vaccine status: Completed vaccines  Qualifies for Shingles Vaccine? Yes   Zostavax completed No   Shingrix Completed?: No.    Education has been provided regarding the importance of this vaccine. Patient has been advised to call insurance company to determine out of pocket expense if they have not yet received this vaccine. Advised may also receive vaccine at local pharmacy or Health Dept. Verbalized acceptance and understanding.  Screening Tests Health Maintenance  Topic Date Due   COVID-19 Vaccine (4 - 2024-25 Marilyn) 04/15/2024   Zoster Vaccines- Shingrix (1 of 2) 09/17/2024 (Originally 04/08/1997)   INFLUENZA VACCINE  07/29/2024   MAMMOGRAM  03/08/2025   Medicare Annual Wellness (AWV)  07/19/2025   Pneumococcal Vaccine: 50+ Years  Completed   DEXA SCAN  Completed   Hepatitis C Screening  Completed   Hepatitis B Vaccines  Aged Out   HPV VACCINES  Aged Out   Meningococcal B Vaccine  Aged Out   DTaP/Tdap/Td  Discontinued   Colonoscopy  Discontinued    Health Maintenance  Health Maintenance Due  Topic Date Due   COVID-19 Vaccine (4 - 2024-25 Marilyn) 04/15/2024    Colorectal cancer screening: No longer required.   Mammogram status: Completed 03/12/2024. Repeat every year  Bone Density status: Completed 10/11/2019. Results reflect: Bone density results: NORMAL. Repeat every 3 years.  Lung Cancer Screening: (Low Dose CT Chest recommended if Age 49-80 years, 20 pack-year currently smoking OR have quit w/in 15years.) does not qualify.   Lung Cancer Screening Referral: N/A  Additional Screening:  Hepatitis C Screening: does qualify; Completed 11/03/2017  Vision Screening: Recommended annual ophthalmology exams for early detection of glaucoma and  other disorders of the eye. Is the patient up to date with their annual eye exam?  Yes  Who is the provider  or what is the name of the office in which the patient attends annual eye exams?   If pt is not established with a provider, would they like to be referred to a provider to establish care? No .   Dental Screening: Recommended annual dental exams for proper oral hygiene   Community Resource Referral / Chronic Care Management: CRR required this visit?  No   CCM required this visit?  No     Plan:     I have personally reviewed and noted the following in the patient's chart:   Medical and social history Use of alcohol, tobacco or illicit drugs  Current medications and supplements including opioid prescriptions. Patient is not currently taking opioid prescriptions. Functional ability and status Nutritional status Physical activity Advanced directives List of other physicians Hospitalizations, surgeries, and ER visits in previous 12 months Vitals Screenings to include cognitive, depression, and falls Referrals and appointments  In addition, I have reviewed and discussed with patient certain preventive protocols, quality metrics, and best practice recommendations. A written personalized care plan for preventive services as well as general preventive health recommendations were provided to patient.     Ronal Slater MARLA Angelena, LPN   2/77/7974   After Visit Summary: (In Person-Printed) AVS printed and given to the patient  Nurse Notes: Discussed Shingrix and Tdap vaccines. Also discussed mammogram and bone density.

## 2024-07-20 ENCOUNTER — Ambulatory Visit: Payer: Self-pay | Admitting: Family Medicine

## 2024-07-23 ENCOUNTER — Ambulatory Visit
Admission: RE | Admit: 2024-07-23 | Discharge: 2024-07-23 | Disposition: A | Discharge status: Home / Self Care | Source: Ambulatory Visit | Attending: Family Medicine | Admitting: Family Medicine

## 2024-07-23 VITALS — BP 134/75 | HR 65 | Temp 98.2°F | Resp 16

## 2024-07-23 DIAGNOSIS — N390 Urinary tract infection, site not specified: Secondary | ICD-10-CM | POA: Diagnosis not present

## 2024-07-23 LAB — POCT URINALYSIS DIP (MANUAL ENTRY)
Bilirubin, UA: NEGATIVE
Glucose, UA: NEGATIVE mg/dL
Ketones, POC UA: NEGATIVE mg/dL
Nitrite, UA: NEGATIVE
Protein Ur, POC: NEGATIVE mg/dL
Spec Grav, UA: 1.01 (ref 1.010–1.025)
Urobilinogen, UA: 0.2 U/dL
pH, UA: 5.5 (ref 5.0–8.0)

## 2024-07-23 MED ORDER — CEPHALEXIN 500 MG PO CAPS: 500.0000 mg | ORAL_CAPSULE | Freq: Two times a day (BID) | ORAL | 0 refills | Status: AC

## 2024-07-23 NOTE — ED Triage Notes (Signed)
 Pt reports urinary frequency, urinary urgency, urinary retention and burning  with urination x 2 days

## 2024-07-23 NOTE — ED Provider Notes (Signed)
 [facility]    CSN: [id number] Arrival date & time: 07/23/24  1148      History   Chief Complaint Chief Complaint  Patient presents with   URI    Entered by patient    HPI Marilyn [name] is a 78 y.o. female.   Patient presenting today with 2-day history of progressively worsening urinary frequency, urgency, retention and dysuria.  Denies fever, chills, nausea, vomiting, flank pain, hematuria.  So far not trying anything over-the-counter for symptoms.  History of urinary tract infections.    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      Home Medications    Prior to Admission medications   Medication Sig Start Date End Date Taking? Authorizing Provider  cephALEXin  (KEFLEX ) 500 MG capsule Take 1 capsule (500 mg total) by mouth 2 (two) times daily. 07/23/24  Yes Stuart Vernell Ballester, PA-C  Apoaequorin (PREVAGEN) 10 MG CAPS Take 10 mg by mouth daily.    [provider]  Ascorbic Acid (VITAMIN C) 100 MG tablet Take 100 mg daily by mouth.    [provider]  aspirin 81 MG tablet Take 81 mg by mouth daily.    [provider]  azithromycin  (ZITHROMAX ) 250 MG tablet Take 1 tablet (250 mg total) by mouth daily. Take first 2 tablets together, then 1 every day until finished. Patient not taking: Reported on 07/19/2024 06/04/24   Leath-Warren, Etta PARAS, NP   benazepril  (LOTENSIN ) 20 MG tablet Take 1 tablet (20 mg total) by mouth daily. 04/19/24   Duanne Butler DASEN, MD  calcium  carbonate (OS-CAL) 600 MG TABS Take 600 mg by mouth daily.    [provider]  Cholecalciferol (VITAMIN D) 2000 UNITS tablet Take 2,000 Units by mouth daily.    [provider]  Glucosamine-Chondroitin (COSAMIN DS PO) Take by mouth daily. Take 1 pill on odd days and 2 pills on even days    [provider]  levothyroxine  (SYNTHROID ) 75 MCG tablet TAKE 1 TABLET DAILY 07/04/24   Duanne Butler DASEN, MD  Multiple Vitamins-Minerals (CENTRUM SILVER 50+WOMEN PO)  05/18/23   [provider]  PFIZER COVID-19 VAC BIVALENT injection  08/26/22   [provider]  promethazine -dextromethorphan (PROMETHAZINE -DM) 6.25-15 MG/5ML syrup Take 5 mLs by mouth at bedtime as needed. Patient not taking: Reported on 07/19/2024 06/04/24   Leath-Warren, Etta PARAS, NP  rosuvastatin  (CRESTOR ) 10 MG tablet TAKE 1 TABLET BY MOUTH EVERY DAY 04/22/24   Duanne Butler DASEN, MD    Family History Family History  Problem Relation Age of Onset   Colon cancer Neg Hx     Social History Social History   Tobacco Use   Smoking status: Former    Current packs/day: 0.00    Types: Cigarettes    Quit date: 02/24/1992    Years since quitting: 32.4  Smokeless tobacco: Never  Vaping Use   Vaping status: Never Used  Substance Use Topics   Alcohol use: No   Drug use: No     Allergies   Other   Review of Systems Review of Systems Per HPI  Physical Exam Triage Vital Signs ED Triage Vitals  Encounter Vitals Group     BP 07/23/24 1210 134/75     Girls Systolic BP Percentile --      Girls Diastolic BP Percentile --      Boys Systolic BP Percentile --      Boys Diastolic BP Percentile --      Pulse Rate 07/23/24 1210 65     Resp 07/23/24 1210 16     Temp 07/23/24 1210 98.2 F (36.8 C)     Temp Source 07/23/24 1210 Oral     SpO2 07/23/24 1210 95 %     Weight --       Height --      Head Circumference --      Peak Flow --      Pain Score 07/23/24 1213 0     Pain Loc --      Pain Education --      Exclude from Growth Chart --    No data found.  Updated Vital Signs BP 134/75 (BP Location: Right Arm)   Pulse 65   Temp 98.2 F (36.8 C) (Oral)   Resp 16   SpO2 95%   Visual Acuity Right Eye Distance:   Left Eye Distance:   Bilateral Distance:    Right Eye Near:   Left Eye Near:    Bilateral Near:     Physical Exam Vitals and nursing note reviewed.  Constitutional:      Appearance: Normal appearance. She is not ill-appearing.  HENT:     Head: Atraumatic.  Eyes:     Extraocular Movements: Extraocular movements intact.     Conjunctiva/sclera: Conjunctivae normal.  Cardiovascular:     Rate and Rhythm: Normal rate.  Pulmonary:     Effort: Pulmonary effort is normal.  Abdominal:     General: Bowel sounds are normal. There is no distension.     Palpations: Abdomen is soft.     Tenderness: There is no abdominal tenderness. There is no right CVA tenderness, left CVA tenderness or guarding.  Musculoskeletal:        General: Normal range of motion.     Cervical back: Normal range of motion and neck supple.  Skin:    General: Skin is warm and dry.  Neurological:     Mental Status: She is alert and oriented to person, place, and time.  Psychiatric:        Mood and Affect: Mood normal.        Thought Content: Thought content normal.        Judgment: Judgment normal.      UC Treatments / Results  Labs (all labs ordered are listed, but only abnormal results are displayed) Labs Reviewed  POCT URINALYSIS DIP (MANUAL ENTRY) - Abnormal; Notable for the following components:      Result Value   Clarity, UA cloudy (*)    Blood, UA large (*)    Leukocytes, UA Small (1+) (*)    All other components within normal limits  URINE CULTURE    EKG   Radiology No results found.  Procedures Procedures (including critical care  time)  Medications Ordered in UC Medications - No data to display  Initial Impression / Assessment and Plan / UC Course  I have reviewed the triage vital signs and the nursing notes.  Pertinent labs & imaging results that were available during my care of the patient were reviewed by me and considered in my medical decision making (see chart for details).     Vitals and exam reassuring today, urinalysis with evidence of urinary tract infection.  Urine culture pending, will treat with Keflex , fluids, and adjust if needed based on culture.  Return for worsening symptoms  Final Clinical Impressions(s) / UC Diagnoses   Final diagnoses:  Acute lower UTI   Discharge Instructions   None    ED Prescriptions     Medication Sig Dispense Auth. Provider   cephALEXin  (KEFLEX ) 500 MG capsule Take 1 capsule (500 mg total) by mouth 2 (two) times daily. 10 capsule Stuart Vernell Dibari, [place]      PDMP not reviewed this encounter.   Stuart Vernell Bunkley, [place] 07/23/24 1254

## 2024-07-26 ENCOUNTER — Ambulatory Visit (HOSPITAL_COMMUNITY): Payer: Self-pay

## 2024-07-26 LAB — URINE CULTURE: Culture: 10000 — AB

## 2024-07-26 MED ORDER — NITROFURANTOIN MONOHYD MACRO 100 MG PO CAPS: 100.0000 mg | ORAL_CAPSULE | Freq: Two times a day (BID) | ORAL | 0 refills | Status: AC

## 2024-08-09 ENCOUNTER — Other Ambulatory Visit: Payer: Self-pay | Admitting: Family Medicine

## 2024-08-09 DIAGNOSIS — I10 Essential (primary) hypertension: Secondary | ICD-10-CM

## 2024-09-12 DIAGNOSIS — M816 Localized osteoporosis [Lequesne]: Secondary | ICD-10-CM | POA: Diagnosis not present

## 2024-09-12 LAB — HM DEXA SCAN

## 2024-10-13 ENCOUNTER — Telehealth: Payer: Self-pay

## 2024-10-13 ENCOUNTER — Other Ambulatory Visit: Payer: Self-pay

## 2024-10-13 MED ORDER — ROSUVASTATIN CALCIUM 10 MG PO TABS: 10.0000 mg | ORAL_TABLET | Freq: Every day | ORAL | 1 refills | Status: AC

## 2024-10-13 NOTE — Telephone Encounter (Signed)
 Sent in medication

## 2024-10-13 NOTE — Telephone Encounter (Signed)
 Prescription Request  10/13/2024  LOV: 07/19/24  What is the name of the medication or equipment? rosuvastatin  (CRESTOR ) 10 MG tablet [[id number]]   Have you contacted your pharmacy to request a refill? Yes   Which pharmacy would you like this sent to?  [facility] - [place], [place] - [facility] AT Portal to Reg[facility] [address] [address] Phone: [phone] Fax: [phone]    Patient notified that their request is being sent to the clinical staff for review and that they should receive a response within 2 business days.   Please advise at [facility] [phone]

## 2024-10-27 DIAGNOSIS — L821 Other seborrheic keratosis: Secondary | ICD-10-CM | POA: Diagnosis not present

## 2024-10-27 DIAGNOSIS — L538 Other specified erythematous conditions: Secondary | ICD-10-CM | POA: Diagnosis not present

## 2024-10-27 DIAGNOSIS — D1801 Hemangioma of skin and subcutaneous tissue: Secondary | ICD-10-CM | POA: Diagnosis not present

## 2024-10-27 DIAGNOSIS — L814 Other melanin hyperpigmentation: Secondary | ICD-10-CM | POA: Diagnosis not present

## 2024-10-27 DIAGNOSIS — L820 Inflamed seborrheic keratosis: Secondary | ICD-10-CM | POA: Diagnosis not present

## 2024-10-27 DIAGNOSIS — L570 Actinic keratosis: Secondary | ICD-10-CM | POA: Diagnosis not present

## 2024-10-31 ENCOUNTER — Encounter: Payer: Self-pay | Admitting: Radiology

## 2024-11-09 ENCOUNTER — Other Ambulatory Visit: Payer: Self-pay | Admitting: Family Medicine

## 2024-11-09 DIAGNOSIS — I10 Essential (primary) hypertension: Secondary | ICD-10-CM

## 2024-12-13 DIAGNOSIS — L659 Nonscarring hair loss, unspecified: Secondary | ICD-10-CM | POA: Diagnosis not present

## 2024-12-13 DIAGNOSIS — L818 Other specified disorders of pigmentation: Secondary | ICD-10-CM | POA: Diagnosis not present

## 2024-12-13 DIAGNOSIS — Z872 Personal history of diseases of the skin and subcutaneous tissue: Secondary | ICD-10-CM | POA: Diagnosis not present

## 2025-01-06 ENCOUNTER — Other Ambulatory Visit

## 2025-01-06 DIAGNOSIS — D72829 Elevated white blood cell count, unspecified: Secondary | ICD-10-CM

## 2025-01-06 DIAGNOSIS — E039 Hypothyroidism, unspecified: Secondary | ICD-10-CM

## 2025-01-06 DIAGNOSIS — I10 Essential (primary) hypertension: Secondary | ICD-10-CM

## 2025-01-06 DIAGNOSIS — R7303 Prediabetes: Secondary | ICD-10-CM

## 2025-01-06 DIAGNOSIS — E7800 Pure hypercholesterolemia, unspecified: Secondary | ICD-10-CM

## 2025-01-06 NOTE — Progress Notes (Signed)
 Marilyn [name]                                          MRN: [id number]   01/06/2025   The VBCI Quality Team Specialist reviewed this patient medical record for the purposes of chart review for care gap closure. The following were reviewed: abstraction for care gap closure-controlling blood pressure.    VBCI Quality Team

## 2025-01-07 LAB — CBC WITH DIFFERENTIAL/PLATELET
Absolute Lymphocytes: 2079 {cells}/uL (ref 850–3900)
Absolute Monocytes: 718 {cells}/uL (ref 200–950)
Basophils Absolute: 37 {cells}/uL (ref 0–200)
Basophils Relative: 0.5 %
Eosinophils Absolute: 207 {cells}/uL (ref 15–500)
Eosinophils Relative: 2.8 %
HCT: 38.9 % (ref 35.9–46.0)
Hemoglobin: 12.7 g/dL (ref 11.7–15.5)
MCH: 30.8 pg (ref 27.0–33.0)
MCHC: 32.6 g/dL (ref 31.6–35.4)
MCV: 94.2 fL (ref 81.4–101.7)
MPV: 9.9 fL (ref 7.5–12.5)
Monocytes Relative: 9.7 %
Neutro Abs: 4359 {cells}/uL (ref 1500–7800)
Neutrophils Relative %: 58.9 %
Platelets: 342 Thousand/uL (ref 140–400)
RBC: 4.13 Million/uL (ref 3.80–5.10)
RDW: 12.2 % (ref 11.0–15.0)
Total Lymphocyte: 28.1 %
WBC: 7.4 Thousand/uL (ref 3.8–10.8)

## 2025-01-07 LAB — COMPLETE METABOLIC PANEL WITHOUT GFR
AG Ratio: 1.4 (calc) (ref 1.0–2.5)
ALT: 12 U/L (ref 6–29)
AST: 17 U/L (ref 10–35)
Albumin: 4.1 g/dL (ref 3.6–5.1)
Alkaline phosphatase (APISO): 72 U/L (ref 37–153)
BUN: 17 mg/dL (ref 7–25)
CO2: 28 mmol/L (ref 20–32)
Calcium: 9.4 mg/dL (ref 8.6–10.4)
Chloride: 104 mmol/L (ref 98–110)
Creat: 0.71 mg/dL (ref 0.60–1.00)
Globulin: 2.9 g/dL (ref 1.9–3.7)
Glucose, Bld: 100 mg/dL — ABNORMAL HIGH (ref 65–99)
Potassium: 4.4 mmol/L (ref 3.5–5.3)
Sodium: 141 mmol/L (ref 135–146)
Total Bilirubin: 0.4 mg/dL (ref 0.2–1.2)
Total Protein: 7 g/dL (ref 6.1–8.1)

## 2025-01-07 LAB — LIPID PANEL
Cholesterol: 133 mg/dL
HDL: 57 mg/dL
LDL Cholesterol (Calc): 62 mg/dL
Non-HDL Cholesterol (Calc): 76 mg/dL
Total CHOL/HDL Ratio: 2.3 (calc)
Triglycerides: 67 mg/dL

## 2025-01-07 LAB — HEMOGLOBIN A1C
Hgb A1c MFr Bld: 6.2 % — ABNORMAL HIGH
Mean Plasma Glucose: 131 mg/dL
eAG (mmol/L): 7.3 mmol/L

## 2025-01-07 LAB — TSH: TSH: 0.76 m[IU]/L (ref 0.40–4.50)

## 2025-01-09 ENCOUNTER — Ambulatory Visit: Payer: Self-pay | Admitting: Family Medicine

## 2025-01-10 ENCOUNTER — Encounter: Payer: Self-pay | Admitting: Family Medicine

## 2025-01-10 ENCOUNTER — Ambulatory Visit: Admitting: Family Medicine

## 2025-01-10 VITALS — BP 130/80 | HR 67 | Temp 97.9°F | Ht 66.0 in | Wt 136.0 lb

## 2025-01-10 DIAGNOSIS — Z1211 Encounter for screening for malignant neoplasm of colon: Secondary | ICD-10-CM

## 2025-01-10 DIAGNOSIS — R7303 Prediabetes: Secondary | ICD-10-CM

## 2025-01-10 DIAGNOSIS — I10 Essential (primary) hypertension: Secondary | ICD-10-CM

## 2025-01-10 DIAGNOSIS — E7800 Pure hypercholesterolemia, unspecified: Secondary | ICD-10-CM | POA: Diagnosis not present

## 2025-01-10 DIAGNOSIS — Z0001 Encounter for general adult medical examination with abnormal findings: Secondary | ICD-10-CM | POA: Diagnosis not present

## 2025-01-10 DIAGNOSIS — E039 Hypothyroidism, unspecified: Secondary | ICD-10-CM | POA: Diagnosis not present

## 2025-01-10 DIAGNOSIS — M810 Age-related osteoporosis without current pathological fracture: Secondary | ICD-10-CM | POA: Diagnosis not present

## 2025-01-10 DIAGNOSIS — Z0000 Encounter for general adult medical examination without abnormal findings: Secondary | ICD-10-CM

## 2025-01-10 MED ORDER — LEVOCETIRIZINE DIHYDROCHLORIDE 5 MG PO TABS: 5.0000 mg | ORAL_TABLET | Freq: Every evening | ORAL | 1 refills | Status: AC

## 2025-01-10 NOTE — Progress Notes (Signed)
 "  Subjective:    Patient ID: Marilyn [name], female    DOB: [birth year], 79 y.o.   MRN: [id number]  Patient is a very pleasant 79 year old Caucasian female here today for checkup.  Patient had a bone density test earlier in 2025 that showed a T-score of -2.8 in the spine.  She took Actonel for 5 years in the remote past but she has not taken it since.  She is currently not on any therapy for osteoporosis.  She is hesitant to want to take a bisphosphonate.  She would like to try Prolia.  We discussed taking 1200 mg a day of calcium  and 1000 units a day of vitamin D.  The patient's mammogram is up-to-date and was performed in 2025.  Due to [name] age she does not require Pap smear.  She had Cologuard in March 2023.  This is coming due this March.  Most recent lab work was significant only for leukocytosis and prediabetes with a hemoglobin A1c of 6.3. Office Visit on 01/10/2025  Component Date Value Ref Range Status   HM Mammogram 06/10/2024 0-4 Bi-Rad  0-4 Bi-Rad, Self Reported Normal Final   HM Dexa Scan 09/12/2024 OSTEOPOROSIS   Final  Lab on 01/06/2025  Component Date Value Ref Range Status   WBC 01/06/2025 7.4  3.8 - 10.8 Thousand/uL Final   RBC 01/06/2025 4.13  3.80 - 5.10 Million/uL Final   Hemoglobin 01/06/2025 12.7  11.7 - 15.5 g/dL Final   HCT 98/90/7973 38.9  35.9 - 46.0 % Final   MCV 01/06/2025 94.2  81.4 - 101.7 fL Final   MCH 01/06/2025 30.8  27.0 - 33.0 pg Final   MCHC 01/06/2025 32.6  31.6 - 35.4 g/dL Final   RDW 98/90/7973 12.2  11.0 - 15.0 % Final   Platelets 01/06/2025 342  140 - 400 Thousand/uL Final   MPV 01/06/2025 9.9  7.5 - 12.5 fL Final   Neutro Abs 01/06/2025 4,359  1,500 - 7,800 cells/uL Final   Absolute Lymphocytes 01/06/2025 2,079  850 - 3,900 cells/uL Final   Absolute Monocytes 01/06/2025 718  200 - 950 cells/uL Final   Eosinophils Absolute 01/06/2025 207  15 - 500 cells/uL Final   Basophils Absolute 01/06/2025 37  0 - 200 cells/uL Final   Neutrophils Relative %  01/06/2025 58.9  % Final   Total Lymphocyte 01/06/2025 28.1  % Final   Monocytes Relative 01/06/2025 9.7  % Final   Eosinophils Relative 01/06/2025 2.8  % Final   Basophils Relative 01/06/2025 0.5  % Final   Glucose, Bld 01/06/2025 100 (H)  65 - 99 mg/dL Final   Comment: .            Fasting reference interval . For someone without known diabetes, a glucose value between 100 and 125 mg/dL is consistent with prediabetes and should be confirmed with a follow-up test. .    BUN 01/06/2025 17  7 - 25 mg/dL Final   Creat 98/90/7973 0.71  0.60 - 1.00 mg/dL Final   BUN/Creatinine Ratio 01/06/2025 SEE NOTE:  6 - 22 (calc) Final   Comment:    Not Reported: BUN and Creatinine are within    reference range. .    Sodium 01/06/2025 141  135 - 146 mmol/L Final   Potassium 01/06/2025 4.4  3.5 - 5.3 mmol/L Final   Chloride 01/06/2025 104  98 - 110 mmol/L Final   CO2 01/06/2025 28  20 - 32 mmol/L Final   Calcium  01/06/2025 9.4  8.6 -  10.4 mg/dL Final   Total Protein 98/90/7973 7.0  6.1 - 8.1 g/dL Final   Albumin 98/90/7973 4.1  3.6 - 5.1 g/dL Final   Globulin 98/90/7973 2.9  1.9 - 3.7 g/dL (calc) Final   AG Ratio 01/06/2025 1.4  1.0 - 2.5 (calc) Final   Total Bilirubin 01/06/2025 0.4  0.2 - 1.2 mg/dL Final   Alkaline phosphatase (APISO) 01/06/2025 72  37 - 153 U/L Final   AST 01/06/2025 17  10 - 35 U/L Final   ALT 01/06/2025 12  6 - 29 U/L Final   Cholesterol 01/06/2025 133  <200 mg/dL Final   HDL 98/90/7973 57  > OR = 50 mg/dL Final   Triglycerides 98/90/7973 67  <150 mg/dL Final   LDL Cholesterol (Calc) 01/06/2025 62  mg/dL (calc) Final   Comment: Reference range: <100 . Desirable range <100 mg/dL for primary prevention;   <70 mg/dL for patients with CHD or diabetic patients  with > or = 2 CHD risk factors. SABRA LDL-C is now calculated using the Martin-Hopkins  calculation, which is a validated novel method providing  better accuracy than the Friedewald equation in the  estimation of  LDL-C.  Gladis APPLETHWAITE et al. SANDREA. 7986;689(80): 2061-2068  (http://education.[facility].com/faq/FAQ164)    Total CHOL/HDL Ratio 01/06/2025 2.3  <4.9 (calc) Final   Non-HDL Cholesterol (Calc) 01/06/2025 76  <130 mg/dL (calc) Final   Comment: For patients with diabetes plus 1 major ASCVD risk  factor, treating to a non-HDL-C goal of <100 mg/dL  (LDL-C of <29 mg/dL) is considered a therapeutic  option.    TSH 01/06/2025 0.76  0.40 - 4.50 mIU/L Final   Hgb A1c MFr Bld 01/06/2025 6.2 (H)  <5.7 % Final   Comment: For someone without known diabetes, a hemoglobin  A1c value between 5.7% and 6.4% is consistent with prediabetes and should be confirmed with a  follow-up test. . For someone with known diabetes, a value <7% indicates that their diabetes is well controlled. A1c targets should be individualized based on duration of diabetes, age, comorbid conditions, and other considerations. . This assay result is consistent with an increased risk of diabetes. . Currently, no consensus exists regarding use of hemoglobin A1c for diagnosis of diabetes for children. .    Mean Plasma Glucose 01/06/2025 131  mg/dL Final   eAG (mmol/L) 98/90/7973 7.3  mmol/L Final     Past Medical History:  Diagnosis Date   Bulging disc    Hypertension    Osteoporosis    Rosacea    Thyroid  disease    Past Surgical History:  Procedure Laterality Date   BILATERAL TEMPOROMANDIBULAR JOINT ARTHROPLASTY     TUBAL LIGATION     Current Outpatient Medications on File Prior to Visit  Medication Sig Dispense Refill   Apoaequorin (PREVAGEN) 10 MG CAPS Take 10 mg by mouth daily.     Ascorbic Acid (VITAMIN C) 100 MG tablet Take 100 mg daily by mouth.     aspirin 81 MG tablet Take 81 mg by mouth daily.     benazepril  (LOTENSIN ) 20 MG tablet TAKE 1 TABLET DAILY 90 tablet 0   calcium  carbonate (OS-CAL) 600 MG TABS Take 600 mg by mouth daily.     Cholecalciferol (VITAMIN D) 2000 UNITS tablet Take 2,000 Units by  mouth daily.     Glucosamine-Chondroitin (COSAMIN DS PO) Take by mouth daily. Take 1 pill on odd days and 2 pills on even days     levothyroxine  (SYNTHROID ) 75 MCG tablet  TAKE 1 TABLET DAILY 90 tablet 3   Multiple Vitamins-Minerals (CENTRUM SILVER 50+WOMEN PO)      PFIZER COVID-19 VAC BIVALENT injection      rosuvastatin  (CRESTOR ) 10 MG tablet Take 1 tablet (10 mg total) by mouth daily. 90 tablet 1   azithromycin  (ZITHROMAX ) 250 MG tablet Take 1 tablet (250 mg total) by mouth daily. Take first 2 tablets together, then 1 every day until finished. (Patient not taking: Reported on 01/10/2025) 6 tablet 0   cephALEXin  (KEFLEX ) 500 MG capsule Take 1 capsule (500 mg total) by mouth 2 (two) times daily. (Patient not taking: Reported on 01/10/2025) 10 capsule 0   nitrofurantoin , macrocrystal-monohydrate, (MACROBID ) 100 MG capsule Take 1 capsule (100 mg total) by mouth 2 (two) times daily. (Patient not taking: Reported on 01/10/2025) 10 capsule 0   promethazine -dextromethorphan (PROMETHAZINE -DM) 6.25-15 MG/5ML syrup Take 5 mLs by mouth at bedtime as needed. (Patient not taking: Reported on 01/10/2025) 118 mL 0   No current facility-administered medications on file prior to visit.   Allergies  Allergen Reactions   Clindamycin Other (See Comments)    clindamycin   Other Diarrhea   Social History   Socioeconomic History   Marital status: Married    Spouse name: Not on file   Number of children: Not on file   Years of education: Not on file   Highest education level: 12th grade  Occupational History   Not on file  Tobacco Use   Smoking status: Former    Current packs/day: 0.00    Types: Cigarettes    Quit date: 02/24/1992    Years since quitting: 32.9   Smokeless tobacco: Never  Vaping Use   Vaping status: Never Used  Substance and Sexual Activity   Alcohol use: No   Drug use: No   Sexual activity: Not on file  Other Topics Concern   Not on file  Social History Narrative   Not on file    Social Drivers of Health   Tobacco Use: Medium Risk (01/10/2025)   Patient History    Smoking Tobacco Use: Former    Smokeless Tobacco Use: Never    Passive Exposure: Not on file  Financial Resource Strain: Low Risk (07/19/2024)   Overall Financial Resource Strain (CARDIA)    Difficulty of Paying Living Expenses: Not hard at all  Food Insecurity: No Food Insecurity (07/19/2024)   Epic    Worried About Radiation Protection Practitioner of Food in the Last Year: Never true    Ran Out of Food in the Last Year: Never true  Transportation Needs: No Transportation Needs (07/19/2024)   Epic    Lack of Transportation (Medical): No    Lack of Transportation (Non-Medical): No  Physical Activity: Sufficiently Active (07/19/2024)   Exercise Vital Sign    Days of Exercise per Week: 3 days    Minutes of Exercise per Session: 60 min  Stress: No Stress Concern Present (07/19/2024)   [employer] of Occupational Health - Occupational Stress Questionnaire    Feeling of Stress: Not at all  Social Connections: Socially Integrated (07/19/2024)   Social Connection and Isolation Panel    Frequency of Communication with Friends and Family: More than three times a week    Frequency of Social Gatherings with Friends and Family: More than three times a week    Attends Religious Services: More than 4 times per year    Active Member of [employer] or Organizations: Yes    Attends Banker Meetings: More than  4 times per year    Marital Status: Married  Catering Manager Violence: Not At Risk (07/19/2024)   Epic    Fear of Current or Ex-Partner: No    Emotionally Abused: No    Physically Abused: No    Sexually Abused: No  Depression (PHQ2-9): Low Risk (07/19/2024)   Depression (PHQ2-9)    PHQ-2 Score: 0  Alcohol Screen: Low Risk (07/19/2024)   Alcohol Screen    Last Alcohol Screening Score (AUDIT): 0  Housing: Low Risk (07/19/2024)   Epic    Unable to Pay for Housing in the Last Year: No    Number of Times Moved in  the Last Year: 0    Homeless in the Last Year: No  Utilities: Not At Risk (07/19/2024)   Epic    Threatened with loss of utilities: No  Health Literacy: Adequate Health Literacy (07/19/2024)   B1300 Health Literacy    Frequency of need for help with medical instructions: Never      Review of Systems  Gastrointestinal:  Positive for diarrhea.  All other systems reviewed and are negative.      Objective:   Physical Exam Vitals reviewed.  Constitutional:      General: She is not in acute distress.    Appearance: Normal appearance. She is normal weight. She is not ill-appearing, toxic-appearing or diaphoretic.  HENT:     Head: Normocephalic and atraumatic.     Right Ear: Tympanic membrane and ear canal normal.     Left Ear: Tympanic membrane and ear canal normal.     Nose: Nose normal. No congestion or rhinorrhea.     Mouth/Throat:     Mouth: Mucous membranes are moist.     Pharynx: Oropharynx is clear. No oropharyngeal exudate or posterior oropharyngeal erythema.  Eyes:     General:        Right eye: No discharge.        Left eye: No discharge.     Extraocular Movements: Extraocular movements intact.     Conjunctiva/sclera: Conjunctivae normal.     Pupils: Pupils are equal, round, and reactive to light.  Neck:     Vascular: No carotid bruit.  Cardiovascular:     Rate and Rhythm: Normal rate and regular rhythm.     Heart sounds: Normal heart sounds. No murmur heard.    No friction rub. No gallop.  Pulmonary:     Effort: Pulmonary effort is normal. No respiratory distress.     Breath sounds: Normal breath sounds. No wheezing or rales.  Abdominal:     General: Abdomen is flat. Bowel sounds are normal. There is no distension.     Palpations: Abdomen is soft. There is no mass.     Tenderness: There is no abdominal tenderness. There is no right CVA tenderness, left CVA tenderness, guarding or rebound.     Hernia: No hernia is present.  Musculoskeletal:     Cervical back:  Neck supple. No rigidity.     Right lower leg: No edema.     Left lower leg: No edema.  Lymphadenopathy:     Cervical: No cervical adenopathy.  Skin:    General: Skin is warm.     Coloration: Skin is not jaundiced or pale.     Findings: No bruising, erythema, lesion or rash.  Neurological:     General: No focal deficit present.     Mental Status: She is alert and oriented to person, place, and time. Mental status is at  baseline.     Cranial Nerves: No cranial nerve deficit.     Sensory: No sensory deficit.     Motor: No weakness.     Coordination: Coordination normal.     Gait: Gait normal.     Deep Tendon Reflexes: Reflexes normal.  Psychiatric:        Mood and Affect: Mood normal.        Behavior: Behavior normal.        Thought Content: Thought content normal.        Judgment: Judgment normal.           Assessment & Plan:  Colon cancer screening - Plan: Cologuard  Primary hypertension  Prediabetes  Hypothyroidism, unspecified type  Pure hypercholesterolemia  General medical exam  Osteoporosis without current pathological fracture, unspecified osteoporosis type  Physical exam today is outstanding.  Blood pressure is excellent.  Cholesterol is excellent.  TSH shows levothyroxine  is within therapeutic range.  Prediabetes is stable.  Recommended a flu shot, and RSV shot, and a shingles shot.  She defers vaccinations today.  I will try to schedule the patient for Prolia injections.  I recommended calcium  1200 mg a day and vitamin D 1000 units a day.  I will schedule the patient for Cologuard to update colon cancer screening.  Mammogram is up-to-date. "
